# Patient Record
Sex: Male | Born: 1945 | Race: White | Hispanic: No | Marital: Married | Smoking: Current every day smoker
Health system: Southern US, Community
[De-identification: ages and names within clinical notes are randomized; demographics above are authoritative.]

## PROBLEM LIST (undated history)

## (undated) DIAGNOSIS — K4090 Unilateral inguinal hernia, without obstruction or gangrene, not specified as recurrent: Secondary | ICD-10-CM

## (undated) DIAGNOSIS — M1990 Unspecified osteoarthritis, unspecified site: Secondary | ICD-10-CM

## (undated) DIAGNOSIS — E785 Hyperlipidemia, unspecified: Secondary | ICD-10-CM

## (undated) DIAGNOSIS — J449 Chronic obstructive pulmonary disease, unspecified: Secondary | ICD-10-CM

## (undated) DIAGNOSIS — I714 Abdominal aortic aneurysm, without rupture, unspecified: Secondary | ICD-10-CM

## (undated) DIAGNOSIS — M5030 Other cervical disc degeneration, unspecified cervical region: Secondary | ICD-10-CM

## (undated) DIAGNOSIS — J479 Bronchiectasis, uncomplicated: Secondary | ICD-10-CM

## (undated) DIAGNOSIS — A150 Tuberculosis of lung: Secondary | ICD-10-CM

## (undated) DIAGNOSIS — M542 Cervicalgia: Secondary | ICD-10-CM

## (undated) DIAGNOSIS — R0789 Other chest pain: Secondary | ICD-10-CM

## (undated) HISTORY — DX: Bronchiectasis, uncomplicated: J47.9

## (undated) HISTORY — PX: OTHER SURGICAL HISTORY: SHX169

## (undated) HISTORY — PX: BRONCHOSCOPY: SUR163

## (undated) HISTORY — DX: Unilateral inguinal hernia, without obstruction or gangrene, not specified as recurrent: K40.90

## (undated) HISTORY — DX: Abdominal aortic aneurysm, without rupture: I71.4

## (undated) HISTORY — DX: Hyperlipidemia, unspecified: E78.5

## (undated) HISTORY — DX: Tuberculosis of lung: A15.0

## (undated) HISTORY — DX: Other cervical disc degeneration, unspecified cervical region: M50.30

## (undated) HISTORY — DX: Other chest pain: R07.89

## (undated) HISTORY — DX: Unspecified osteoarthritis, unspecified site: M19.90

## (undated) HISTORY — DX: Chronic obstructive pulmonary disease, unspecified: J44.9

## (undated) HISTORY — DX: Cervicalgia: M54.2

## (undated) HISTORY — PX: WRIST SURGERY: SHX841

## (undated) HISTORY — DX: Abdominal aortic aneurysm, without rupture, unspecified: I71.40

---

## 2004-03-21 ENCOUNTER — Ambulatory Visit: Payer: Self-pay | Admitting: Internal Medicine

## 2004-03-29 ENCOUNTER — Ambulatory Visit: Payer: Self-pay | Admitting: Internal Medicine

## 2004-04-05 ENCOUNTER — Ambulatory Visit: Payer: Self-pay | Admitting: Internal Medicine

## 2004-04-05 ENCOUNTER — Encounter: Payer: Self-pay | Admitting: Family Medicine

## 2004-04-05 ENCOUNTER — Ambulatory Visit: Admission: RE | Admit: 2004-04-05 | Discharge: 2004-04-05 | Payer: Self-pay | Admitting: Internal Medicine

## 2004-04-05 ENCOUNTER — Encounter (INDEPENDENT_AMBULATORY_CARE_PROVIDER_SITE_OTHER): Payer: Self-pay | Admitting: *Deleted

## 2005-05-03 ENCOUNTER — Ambulatory Visit (HOSPITAL_COMMUNITY): Admission: RE | Admit: 2005-05-03 | Discharge: 2005-05-03 | Payer: Self-pay | Admitting: Orthopedic Surgery

## 2005-05-10 ENCOUNTER — Ambulatory Visit (HOSPITAL_COMMUNITY): Admission: RE | Admit: 2005-05-10 | Discharge: 2005-05-10 | Payer: Self-pay | Admitting: Orthopedic Surgery

## 2005-08-14 ENCOUNTER — Encounter: Payer: Self-pay | Admitting: Family Medicine

## 2005-08-14 ENCOUNTER — Ambulatory Visit (HOSPITAL_BASED_OUTPATIENT_CLINIC_OR_DEPARTMENT_OTHER): Admission: RE | Admit: 2005-08-14 | Discharge: 2005-08-14 | Payer: Self-pay | Admitting: Orthopedic Surgery

## 2005-08-14 ENCOUNTER — Encounter (INDEPENDENT_AMBULATORY_CARE_PROVIDER_SITE_OTHER): Payer: Self-pay | Admitting: Specialist

## 2008-04-27 ENCOUNTER — Ambulatory Visit: Payer: Self-pay | Admitting: Family Medicine

## 2008-04-27 DIAGNOSIS — M542 Cervicalgia: Secondary | ICD-10-CM

## 2008-04-27 DIAGNOSIS — M1990 Unspecified osteoarthritis, unspecified site: Secondary | ICD-10-CM | POA: Insufficient documentation

## 2008-05-12 ENCOUNTER — Encounter: Admission: RE | Admit: 2008-05-12 | Discharge: 2008-05-12 | Payer: Self-pay | Admitting: Family Medicine

## 2008-05-13 ENCOUNTER — Ambulatory Visit: Payer: Self-pay | Admitting: Family Medicine

## 2008-05-13 ENCOUNTER — Encounter: Admission: RE | Admit: 2008-05-13 | Discharge: 2008-05-13 | Payer: Self-pay | Admitting: Family Medicine

## 2008-05-13 DIAGNOSIS — E785 Hyperlipidemia, unspecified: Secondary | ICD-10-CM | POA: Insufficient documentation

## 2008-05-13 DIAGNOSIS — M5030 Other cervical disc degeneration, unspecified cervical region: Secondary | ICD-10-CM | POA: Insufficient documentation

## 2008-05-13 DIAGNOSIS — F17200 Nicotine dependence, unspecified, uncomplicated: Secondary | ICD-10-CM | POA: Insufficient documentation

## 2008-05-18 ENCOUNTER — Encounter: Payer: Self-pay | Admitting: Family Medicine

## 2008-05-19 ENCOUNTER — Telehealth: Payer: Self-pay | Admitting: Family Medicine

## 2008-05-24 ENCOUNTER — Encounter: Payer: Self-pay | Admitting: Family Medicine

## 2008-05-24 ENCOUNTER — Ambulatory Visit: Payer: Self-pay

## 2008-08-12 ENCOUNTER — Telehealth (INDEPENDENT_AMBULATORY_CARE_PROVIDER_SITE_OTHER): Payer: Self-pay | Admitting: *Deleted

## 2008-08-16 ENCOUNTER — Ambulatory Visit: Payer: Self-pay | Admitting: Family Medicine

## 2008-08-16 ENCOUNTER — Encounter: Admission: RE | Admit: 2008-08-16 | Discharge: 2008-08-16 | Payer: Self-pay | Admitting: Family Medicine

## 2008-08-16 DIAGNOSIS — I714 Abdominal aortic aneurysm, without rupture, unspecified: Secondary | ICD-10-CM | POA: Insufficient documentation

## 2008-08-16 DIAGNOSIS — R930 Abnormal findings on diagnostic imaging of skull and head, not elsewhere classified: Secondary | ICD-10-CM | POA: Insufficient documentation

## 2008-08-17 ENCOUNTER — Encounter: Payer: Self-pay | Admitting: Family Medicine

## 2008-08-17 LAB — CONVERTED CEMR LAB
ANA Titer 1: 1:40 {titer} — ABNORMAL HIGH
Anti Nuclear Antibody(ANA): POSITIVE — AB
Direct LDL: 72 mg/dL
Rheumatoid fact SerPl-aCnc: 98 intl units/mL — ABNORMAL HIGH (ref 0–20)
Sed Rate: 2 mm/hr (ref 0–16)

## 2008-08-19 ENCOUNTER — Encounter: Payer: Self-pay | Admitting: Family Medicine

## 2008-11-05 ENCOUNTER — Encounter: Payer: Self-pay | Admitting: Family Medicine

## 2009-01-06 ENCOUNTER — Encounter: Payer: Self-pay | Admitting: Family Medicine

## 2009-02-18 ENCOUNTER — Ambulatory Visit: Payer: Self-pay | Admitting: Family Medicine

## 2009-02-18 DIAGNOSIS — J441 Chronic obstructive pulmonary disease with (acute) exacerbation: Secondary | ICD-10-CM

## 2009-04-04 ENCOUNTER — Ambulatory Visit: Payer: Self-pay | Admitting: Family Medicine

## 2009-04-04 ENCOUNTER — Encounter: Admission: RE | Admit: 2009-04-04 | Discharge: 2009-04-04 | Payer: Self-pay | Admitting: Family Medicine

## 2009-04-04 DIAGNOSIS — R0789 Other chest pain: Secondary | ICD-10-CM | POA: Insufficient documentation

## 2009-04-19 ENCOUNTER — Ambulatory Visit: Payer: Self-pay | Admitting: Pulmonary Disease

## 2009-04-22 ENCOUNTER — Encounter: Payer: Self-pay | Admitting: Pulmonary Disease

## 2009-04-22 ENCOUNTER — Telehealth (INDEPENDENT_AMBULATORY_CARE_PROVIDER_SITE_OTHER): Payer: Self-pay | Admitting: *Deleted

## 2009-04-25 ENCOUNTER — Ambulatory Visit: Payer: Self-pay | Admitting: Diagnostic Radiology

## 2009-04-25 ENCOUNTER — Ambulatory Visit (HOSPITAL_BASED_OUTPATIENT_CLINIC_OR_DEPARTMENT_OTHER): Admission: RE | Admit: 2009-04-25 | Discharge: 2009-04-25 | Payer: Self-pay | Admitting: Pulmonary Disease

## 2009-06-02 ENCOUNTER — Ambulatory Visit: Payer: Self-pay | Admitting: Pulmonary Disease

## 2009-06-02 DIAGNOSIS — J479 Bronchiectasis, uncomplicated: Secondary | ICD-10-CM

## 2009-08-23 ENCOUNTER — Encounter: Payer: Self-pay | Admitting: Family Medicine

## 2009-09-14 ENCOUNTER — Ambulatory Visit: Payer: Self-pay | Admitting: Family Medicine

## 2009-09-14 ENCOUNTER — Encounter: Admission: RE | Admit: 2009-09-14 | Discharge: 2009-09-14 | Payer: Self-pay | Admitting: Family Medicine

## 2009-09-14 DIAGNOSIS — K4090 Unilateral inguinal hernia, without obstruction or gangrene, not specified as recurrent: Secondary | ICD-10-CM | POA: Insufficient documentation

## 2009-09-20 ENCOUNTER — Encounter: Payer: Self-pay | Admitting: Family Medicine

## 2010-03-21 ENCOUNTER — Encounter: Payer: Self-pay | Admitting: Family Medicine

## 2010-04-06 ENCOUNTER — Ambulatory Visit: Payer: Self-pay | Admitting: Family Medicine

## 2010-06-04 LAB — CONVERTED CEMR LAB
ALT: 11 units/L (ref 0–53)
AST: 11 units/L (ref 0–37)
Albumin: 4.3 g/dL (ref 3.5–5.2)
Alkaline Phosphatase: 62 units/L (ref 39–117)
BUN: 12 mg/dL (ref 6–23)
CO2: 24 meq/L (ref 19–32)
Calcium: 9.2 mg/dL (ref 8.4–10.5)
Chloride: 103 meq/L (ref 96–112)
Cholesterol: 269 mg/dL — ABNORMAL HIGH (ref 0–200)
Creatinine, Ser: 0.66 mg/dL (ref 0.40–1.50)
Glucose, Bld: 96 mg/dL (ref 70–99)
HCT: 47.8 % (ref 39.0–52.0)
HDL: 57 mg/dL (ref >39)
Hemoglobin: 15.8 g/dL (ref 13.0–17.0)
LDL Cholesterol: 172 mg/dL — ABNORMAL HIGH (ref 0–99)
MCHC: 33.1 g/dL (ref 30.0–36.0)
MCV: 93.2 fL (ref 78.0–100.0)
PSA: 0.76 ng/mL (ref 0.10–4.00)
Platelets: 241 10*3/uL (ref 150–400)
Potassium: 4.7 meq/L (ref 3.5–5.3)
RBC: 5.13 M/uL (ref 4.22–5.81)
RDW: 14.9 % (ref 11.5–15.5)
Sodium: 141 meq/L (ref 135–145)
Total Bilirubin: 0.6 mg/dL (ref 0.3–1.2)
Total CHOL/HDL Ratio: 4.7
Total Protein: 7.3 g/dL (ref 6.0–8.3)
Triglycerides: 198 mg/dL — ABNORMAL HIGH (ref <150)
VLDL: 40 mg/dL (ref 0–40)
WBC: 11.6 10*3/uL — ABNORMAL HIGH (ref 4.0–10.5)

## 2010-06-06 NOTE — Assessment & Plan Note (Signed)
Summary: hfu/  pt okay with h/p mbw   Visit Type:  Follow-up Copy to:  PCP Primary Provider/Referring Provider:  Seymour Bars DO  CC:  Pt here for follow up. Pt c/o productive cough with yellow mucus in AM and clear in the PM starting yesterday. Pt c/o being lethargic, S.O.B., wheezing, chest tightness, chest and head congestion, and lightheaded.  History of Present Illness: 63/M, ex heavy smoker,of [place] descent for evaluation of COPd & chest pain. He reports knife like rt chest wall pain x few weeks, non exertional, non pleuritic. He reports dyspne aon exertion esp walking uphill or climbing stairs. He sees Dr Durenda Age for RA, on methotrexate, HLA B 27 pos.(reviewed last note) He quit smoking in 4/10. Underwent bronchoscopy in 11/05 by Sherene Sires for RML cavitary mass which has since resolved , afb/ funagal neg. He reports Tb at age 35  CXR 04/04/09 -hyperinflation. spirometry >> moderate airway obstruction, FEv1 60% reviewed CT chest - RMl bronchectasis, ? related to old TB, emphysema.  June 02, 2009 3:00 PM  c/o head & chest cold x 2 days , green phlegm, increased dyspnea & wheezing  Current Medications (verified): 1)  Lipitor 80 Mg Tabs (Atorvastatin Calcium) .Marland Kitchen.. 1 Tab By Mouth Qhs 2)  Ibuprofen 800 Mg Tabs (Ibuprofen) .Marland Kitchen.. 1 Tab By Mouth Three Times A Day With Food X 14 Days 3)  Folic Acid 1 Mg Tabs (Folic Acid) .... 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 2 grown kids in [place].  1 granddaughter. Smokes 1.5 ppd x 50 yrs. No regular exercise. 10 caffeine/ day Patient states former smoker.   Review of Systems       The patient complains of dyspnea on exertion and prolonged cough.   The patient denies anorexia, fever, weight loss, weight gain, vision loss, decreased hearing, hoarseness, chest pain, syncope, peripheral edema, headaches, hemoptysis, abdominal pain, melena, hematochezia, severe indigestion/heartburn, hematuria, muscle weakness, suspicious skin lesions, difficulty walking, depression, unusual weight change, and abnormal bleeding.    Vital Signs:  Patient profile:   65 year old male Height:      71.5 inches Weight:      160 pounds O2 Sat:      96 % on Room air Temp:     98.6 degrees F oral Pulse rate:   76 / minute BP sitting:   92 / 64  (right arm) Cuff size:   regular  Vitals Entered By: Zackery Barefoot CMA (June 02, 2009 2:16 PM)  O2 Flow:  Room air CC: Pt here for follow up. Pt c/o productive cough with yellow mucus in AM and clear in the PM starting yesterday. Pt c/o being lethargic, S.O.B., wheezing, chest tightness, chest and head congestion, lightheaded Comments Medications reviewed with patient Verified pt's contact number Zackery Barefoot CMA  June 02, 2009 2:21 PM    Physical Exam  Additional Exam:  Gen. Pleasant, thin, in no distress, normal affect ENT - no lesions, no post nasal drip, nares congested Neck: No JVD, no thyromegaly, no carotid bruits Lungs: no use of accessory muscles, no dullness to percussion, decreased without rales or rhonchi  Cardiovascular: Rhythm regular, heart sounds  normal, no murmurs or gallops, no peripheral edema  Musculoskeletal: No deformities,  no cyanosis or clubbing      Impression & Recommendations:  Problem # 1:  BRONCHITIS, ACUTE (ICD-466.0)  ABx , short course of steroids by mouth . he will call if no better . OTC meds for symptomatic relief. His updated medication list for this problem includes:    Avelox 400 Mg Tabs (Moxifloxacin hcl) ..... Once daily  Orders: Est. Patient Level III (14782)  Problem # 2:  CHRONIC OBSTRUCTIVE PULMONARY DISEASE, ACUTE EXACERBATION  (ICD-491.21)  Will assess need for LABA next visit    Orders: Est. Patient Level III (95621)  Problem # 3:  BRONCHIECTASIS W/O ACUTE EXACERBATION (ICD-494.0) Assessment: New Focal RML   Medications Added to Medication List This Visit: 1)  Folic Acid 1 Mg Tabs (Folic acid) .... Take 1 tablet by mouth once a day 2)  Avelox 400 Mg Tabs (Moxifloxacin hcl) .... Once daily 3)  Prednisone 10 Mg Tabs (Prednisone) .... Take  2 tabs daily x 5 days, then 1 tab daily x5 days then stop. #15  Patient Instructions: 1)  Please schedule a follow-up appointment in 3 months. 2)  Antibiotic x 7 days 3)  Prednisone as directed 4)  Tylenol sinus for congestion 5)   Robitussin DM cough syrup 2 tsp two times a day  6)  Copy sent to: Seymour Bars Prescriptions: PREDNISONE 10 MG TABS (PREDNISONE) Take  2 tabs daily x 5 days, then 1 tab daily x5 days then stop. #15  #15 x 1   Entered and Authorized by:   Comer Locket Vassie Loll MD   Signed by:   Comer Locket Vassie Loll MD on 06/02/2009   Method used:   Electronically to        [facility] [phone]* (retail)       [address] [place], [place]  [address]       Ph: [phone]       Fax: [phone]   RxID:   [id number] AVELOX 400 MG TABS (MOXIFLOXACIN HCL) once daily  #7 x 0   Entered and Authorized by:   Comer Locket. Vassie Loll MD   Signed by:   Comer Locket Vassie Loll MD on 06/02/2009   Method used:   Electronically to        [facility] [phone]* (retail)       [address] [place], [place]  [address]       Ph: [phone]       Fax: [phone]   RxID:   [id number]

## 2010-06-06 NOTE — Assessment & Plan Note (Signed)
Summary: ? hernia   Vital Signs:  Patient profile:   65 year old male Height:      71.5 inches Weight:      159 pounds BMI:     21.95 O2 Sat:      97 % on Room air Pulse rate:   72 / minute BP sitting:   98 / 66  (left arm) Cuff size:   regular  Vitals Entered By: Payton Spark CMA (Sep 14, 2009 9:39 AM)  O2 Flow:  Room air CC: ? hernia LLQ x 2 months.   Primary Care Provider:  Seymour Bars DO  CC:  ? hernia LLQ x 2 months..  History of Present Illness: 65 yo WM presents for LLQ pain x 2 months on and off.  Occurs with lifting. Feels like a pulling sensation.   He has seen a buldge.  He denies a hx of hernia.  Denies any radiation into the groin or scrotum.  Denies diarrhea or constipation  or blood in the stool.  Denies any fever.  He always has a poor appetite.  His weight has been stable.  He has never had a colonoscopy.  Has never had abdominal surgery.  Denies any voiding complaints.  Denies any LE edema.   Current Medications (verified): 1)  Lipitor 80 Mg Tabs (Atorvastatin Calcium) .Marland Kitchen.. 1 Tab By Mouth Qhs 2)  Folic Acid 1 Mg Tabs (Folic Acid) .... Take 1 Tablet By Mouth Once A Day 3)  Methotrexate 2.5 Mg Tabs (Methotrexate Sodium) .... Take 10 Tabs By Mouth Every Monday  Allergies (verified): No Known Drug Allergies  Past History:  Past Medical History: Hx of TB degenerative arthritis smoker-- quit 4-10 COPD  Past Surgical History: Reviewed history from 08/16/2008 and no changes required. L wrist surgery 1984 R MCP surgery 2006 bronchoscopy for scarred lung 2002 from TB R wrist/ hand surgery 05-2008  Social History: Reviewed history from 04/19/2009 and no changes required. 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 2 grown kids in [place].  1 granddaughter. Smokes 1.5 ppd x 50 yrs. No regular exercise. 10 caffeine/ day Patient states former smoker.   Review of Systems      See HPI  Physical  Exam  General:  alert, well-developed, well-nourished, and well-hydrated.   Head:  normocephalic and atraumatic.   Eyes:  sclera non icteric Nose:  no nasal discharge.   Mouth:  pharynx pink and moist.  edentuous Neck:  no masses.   Lungs:  prolonged exp phase with lungs CTA w/o W/R/R; nonlabored  Heart:  normal rate, regular rhythm, and no murmur.   Abdomen:  soft.  LLQ TTP with buldge palpable on standing that is tender and reducible, quarter sized.  NABS. Genitalia:  no testicular buldges, nodules or tenderness Extremities:  no LE edema Skin:  color normal.   Psych:  good eye contact, not anxious appearing, and not depressed appearing.     Impression & Recommendations:  Problem # 1:  ABDOMINAL PAIN, LEFT LOWER QUADRANT (ICD-789.04) Assessment New Likely a hernia.  Less likely diverticulitis.  Will get a CT to confirm presence of hernia today.  F/u results tomorrow.  Refer to gen surgery if indicated.  He is overdue for a colonosocpy.  No sign of acute abdomen.  Avoid heavy lifting for now.   Orders: T-CT Abdomen/pelvis w (04540)  Complete Medication List: 1)  Lipitor 80 Mg Tabs (Atorvastatin calcium) .Marland Kitchen.. 1 tab by mouth qhs  2)  Folic Acid 1 Mg Tabs (Folic acid) .... Take 1 tablet by mouth once a day 3)  Methotrexate 2.5 Mg Tabs (Methotrexate sodium) .... Take 10 tabs by mouth every monday  Patient Instructions: 1)  CT abdomen/ pelvis downstairs today. 2)  Will call you w/ results tomorrow. 3)  If + for hernia, will schedule you an appt with general surgery in the office to discuss repair.

## 2010-06-06 NOTE — Letter (Signed)
Summary: [facility]   Imported By: Lanelle Bal 09/15/2009 12:17:18  _____________________________________________________________________  External Attachment:    Type:   Image     Comment:   External Document

## 2010-06-06 NOTE — Consult Note (Signed)
Summary: [facility]   Imported By: Lanelle Bal 10/06/2009 09:20:26  _____________________________________________________________________  External Attachment:    Type:   Image     Comment:   External Document

## 2010-06-06 NOTE — Assessment & Plan Note (Signed)
Summary: COPD excacerbation   Vital Signs:  Patient profile:   65 year old male Height:      71.5 inches Weight:      149 pounds BMI:     20.57 O2 Sat:      94 % on Room air Temp:     98.8 degrees F oral Pulse rate:   76 / minute BP sitting:   107 / 61  (left arm) Cuff size:   regular  Vitals Entered By: Payton Spark CMA (April 06, 2010 2:50 PM)  O2 Flow:  Room air CC: Congestion x 2 weeks.   Primary Care Provider:  Seymour Bars DO  CC:  Congestion x 2 weeks.Marland Kitchen  History of Present Illness: Congestion x 2 weeks. Has been SOB.  Hx of COPD.  Productive in the AM. No fever.  No ST, ear pain or HA.  He is a smoker. He saw Dr. Vassie Loll.   Current Medications (verified): 1)  Lipitor 80 Mg Tabs (Atorvastatin Calcium) .Marland Kitchen.. 1 Tab By Mouth Qhs 2)  Folic Acid 1 Mg Tabs (Folic Acid) .... Take 1 Tablet By Mouth Once A Day 3)  Methotrexate 2.5 Mg Tabs (Methotrexate Sodium) .... Take 10 Tabs By Mouth Every Monday  Allergies (verified): No Known Drug Allergies  Past History:  Past Medical History: Last updated: 09/14/2009 Hx of TB degenerative arthritis smoker-- quit 4-10 COPD  Past Surgical History: Last updated: 08/16/2008 L wrist surgery 1984 R MCP surgery 2006 bronchoscopy for scarred lung 2002 from TB R wrist/ hand surgery 05-2008  Family History: Last updated: 04/27/2008 brother AMI, stroke. 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 2 grown kids in [place].  1 granddaughter. Smokes 1.5 ppd x 50 yrs. No regular exercise. 10 caffeine/ day Patient states former smoker.   Social History: Reviewed history from 04/19/2009 and no changes required. 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 2 grown kids in [place].  1  granddaughter. Smokes 1.5 ppd x 50 yrs. No regular exercise. 10 caffeine/ day Patient states former smoker.   Physical Exam  General:  Well-developed,well-nourished,in no acute distress; alert,appropriate and cooperative throughout examination Head:  Normocephalic and atraumatic without obvious abnormalities. No apparent alopecia or balding. Eyes:  No corneal or conjunctival inflammation noted. EOMI. Perrla. Ears:  External ear exam shows no significant lesions or deformities.  Otoscopic examination reveals clear canals, tympanic membranes are intact bilaterally without bulging, retraction, inflammation or discharge. Hearing is grossly normal bilaterally. Nose:  External nasal examination shows no deformity or inflammation. Nasal mucosa are pink and moist without lesions or exudates. Mouth:  Oral mucosa and oropharynx without lesions or exudates.  Neck:  No deformities, masses, or tenderness noted. Lungs:  Normal respiratory effort, chest expands symmetrically. Lungs are clear to auscultation, no crackles or wheezes. Heart:  Normal rate and regular rhythm. S1 and S2 normal without gallop, murmur, click, rub or other extra sounds. Skin:  no rashes.   Cervical Nodes:  No lymphadenopathy noted Psych:  Cognition and judgment appear intact. Alert and cooperative with normal attention span and concentration. No apparent delusions, illusions, hallucinations   Impression & Recommendations:  Problem # 1:  CHRONIC OBSTRUCTIVE PULMONARY DISEASE, ACUTE EXACERBATION (ICD-491.21) COPD exacerbation.  Will tx with ABX.  I don't think he is severe enough  for a course of steroid. He is not wheeezing adn no SOB in the room. Pulse ox is at his baseline.  Needs to keep f/u with Dr. Vassie Loll if has one or see Dr. .  Per his CT he has severe COPD so I am not sure why he is not on something prophylactically.there is a note from Dr. Vassie Loll about starting a SABA but I see nothing on his medlist.   I did write for an  albuterol inhaler. Hopefully if starts using this will be able to avoid steroid. Call if not getting better or suddenly gets worse or SOB.    Complete Medication List: 1)  Lipitor 80 Mg Tabs (Atorvastatin calcium) .Marland Kitchen.. 1 tab by mouth qhs 2)  Folic Acid 1 Mg Tabs (Folic acid) .... Take 1 tablet by mouth once a day 3)  Methotrexate 2.5 Mg Tabs (Methotrexate sodium) .... Take 10 tabs by mouth every monday 4)  Avelox 400 Mg Tabs (Moxifloxacin hcl) .... Take 1 tablet by mouth once a day x 5 days 5)  Proair Hfa 108 (90 Base) Mcg/act Aers (Albuterol sulfate) .... 2- 4 puffs inhaled eveyr 4-6 hours as needed for shortness of breath.  Patient Instructions: 1)  Call if not better in one week 2)   Use the proair 2 puffs every 4-6 hours as needed for cough and shortness of breath.  Prescriptions: PROAIR HFA 108 (90 BASE) MCG/ACT AERS (ALBUTEROL SULFATE) 2- 4 puffs inhaled eveyr 4-6 hours as needed for shortness of breath.  #1 x 1   Entered and Authorized by:   Nani Gasser MD   Signed by:   Nani Gasser MD on 04/06/2010   Method used:   Electronically to        [facility] [phone]* (retail)       [address] [place], [place]  [address]       Ph: [phone]       Fax: [phone]   RxID:   [id number] AVELOX 400 MG TABS (MOXIFLOXACIN HCL) Take 1 tablet by mouth once a day x 5 days  #5 x 0   Entered and Authorized by:   Nani Gasser MD   Signed by:   Nani Gasser MD on 04/06/2010   Method used:   Electronically to        [facility] [phone]* (retail)       [address]       [place], [place]  [address]       Ph: [phone]       Fax: [phone]   RxID:   [id number]    Orders Added: 1)  Est. Patient Level IV [02585]  Appended Document: COPD excacerbation Pharmacy calls and insurance will not pay for proair so they are switching to the Ventolin HFA inhaler with 1 refill. KJ LPN 27/11/8240

## 2010-06-06 NOTE — Letter (Signed)
Summary: [facility]   Imported By: Lanelle Bal 04/11/2010 14:04:05  _____________________________________________________________________  External Attachment:    Type:   Image     Comment:   External Document

## 2010-07-07 ENCOUNTER — Telehealth: Payer: Self-pay | Admitting: Family Medicine

## 2010-07-10 ENCOUNTER — Encounter: Payer: Self-pay | Admitting: Family Medicine

## 2010-07-10 ENCOUNTER — Ambulatory Visit (INDEPENDENT_AMBULATORY_CARE_PROVIDER_SITE_OTHER): Payer: Medicare Other | Admitting: Family Medicine

## 2010-07-10 DIAGNOSIS — J449 Chronic obstructive pulmonary disease, unspecified: Secondary | ICD-10-CM | POA: Insufficient documentation

## 2010-07-10 DIAGNOSIS — E785 Hyperlipidemia, unspecified: Secondary | ICD-10-CM

## 2010-07-10 DIAGNOSIS — I714 Abdominal aortic aneurysm, without rupture: Secondary | ICD-10-CM

## 2010-07-10 DIAGNOSIS — J4489 Other specified chronic obstructive pulmonary disease: Secondary | ICD-10-CM | POA: Insufficient documentation

## 2010-07-10 DIAGNOSIS — M069 Rheumatoid arthritis, unspecified: Secondary | ICD-10-CM

## 2010-07-11 LAB — CONVERTED CEMR LAB
ALT: 9 units/L (ref 0–53)
AST: 13 units/L (ref 0–37)
Albumin: 4.3 g/dL (ref 3.5–5.2)
Alkaline Phosphatase: 48 units/L (ref 39–117)
BUN: 13 mg/dL (ref 6–23)
CO2: 29 meq/L (ref 19–32)
Calcium: 8.8 mg/dL (ref 8.4–10.5)
Chloride: 106 meq/L (ref 96–112)
Cholesterol: 137 mg/dL (ref 0–200)
Creatinine, Ser: 0.59 mg/dL (ref 0.40–1.50)
Glucose, Bld: 108 mg/dL — ABNORMAL HIGH (ref 70–99)
HCT: 43.8 % (ref 39.0–52.0)
HDL: 52 mg/dL (ref >39)
Hemoglobin: 14.6 g/dL (ref 13.0–17.0)
LDL Cholesterol: 72 mg/dL (ref 0–99)
MCHC: 33.3 g/dL (ref 30.0–36.0)
MCV: 95.8 fL (ref 78.0–100.0)
PSA, Free Pct: 50 (ref >25)
PSA, Free: 0.2 ng/mL
PSA: 0.4 ng/mL (ref <=4.00)
Platelets: 216 10*3/uL (ref 150–400)
Potassium: 4.2 meq/L (ref 3.5–5.3)
RBC: 4.57 M/uL (ref 4.22–5.81)
RDW: 15.8 % — ABNORMAL HIGH (ref 11.5–15.5)
Sed Rate: 1 mm/hr (ref 0–16)
Sodium: 142 meq/L (ref 135–145)
Total Bilirubin: 0.8 mg/dL (ref 0.3–1.2)
Total CHOL/HDL Ratio: 2.6
Total Protein: 6.4 g/dL (ref 6.0–8.3)
Triglycerides: 64 mg/dL (ref <150)
VLDL: 13 mg/dL (ref 0–40)
WBC: 5.8 10*3/uL (ref 4.0–10.5)

## 2010-07-13 NOTE — Progress Notes (Signed)
Summary: Rx. refill on Lipitor and Albuterol  Phone Note Refill Request Message from:  Patient on July 07, 2010 4:05 PM  Refills Requested: Medication #1:  LIPITOR 80 MG TABS 1 tab by mouth qhs Patient states that he has been out for 8 days and he has been told by his pharmacy that they have tried to reach [place] but no one is responding.Marland KitchenMarland KitchenPlease call in his Lipitor as soon as possible. He also needs some more Albuterol called in that was given to him one day when Dr. Cathey Endow was out. Thanks.Michaelle Copas  July 07, 2010 4:07 PM   Initial call taken by: Michaelle Copas,  July 07, 2010 4:07 PM  Follow-up for Phone Call        cannot fill lipitor -- overdue for labs. Will fill his albuterol. scheduel OV to see me and do fasting labs in the next 2 wks. Follow-up by: Seymour Bars DO,  July 07, 2010 4:09 PM    Prescriptions: PROAIR HFA 108 (90 BASE) MCG/ACT AERS (ALBUTEROL SULFATE) 2- 4 puffs inhaled eveyr 4-6 hours as needed for shortness of breath.  #1 x 1   Entered and Authorized by:   Seymour Bars DO   Signed by:   Seymour Bars DO on 07/07/2010   Method used:   Electronically to        [facility] [phone]* (retail)       [address] [place], [place]  [address]       Ph: [phone]       Fax: [phone]   RxID:   [id number]

## 2010-07-18 NOTE — Assessment & Plan Note (Signed)
Summary: f/u cholesterol   Vital Signs:  Patient profile:   65 year old male Height:      71.5 inches Weight:      158 pounds BMI:     21.81 O2 Sat:      96 % on Room air Pulse rate:   70 / minute BP sitting:   111 / 70  (left arm) Cuff size:   regular  Vitals Entered By: Payton Spark CMA (July 10, 2010 8:17 AM)  O2 Flow:  Room air CC: F/U cholesterol   Primary Care Provider:  Seymour Bars DO  CC:  F/U cholesterol.  History of Present Illness: 65 yo WM presents for f/u fasting labs.  He has hx of hyperlipidemia, RA (see Dr D[facility]en P[facility] at [facility]), COPD (has seen Dr Vassie Loll) and AAA.  He continues to smoke.  Denies CP but has chronic DOE.  Using a rescue inhaler only at night.  He is now on MTX and Folic Acid whch has really helped his RA.        Current Medications (verified): 1)  Lipitor 80 Mg Tabs (Atorvastatin Calcium) .Marland Kitchen.. 1 Tab By Mouth Qhs 2)  Folic Acid 1 Mg Tabs (Folic Acid) .... Take 1 Tablet By Mouth Once A Day 3)  Methotrexate 2.5 Mg Tabs (Methotrexate Sodium) .... Take 10 Tabs By Mouth Every Monday 4)  Avelox 400 Mg Tabs (Moxifloxacin Hcl) .... Take 1 Tablet By Mouth Once A Day X 5 Days 5)  Proair Hfa 108 (90 Base) Mcg/act Aers (Albuterol Sulfate) .... 2- 4 Puffs Inhaled Eveyr 4-6 Hours As Needed For Shortness of Breath.  Allergies (verified): No Known Drug Allergies  Past History:  Past Medical History: Hx of TB RA -- DR Dareen Piano smoker-- quit 4-10 COPD-- Dr Vassie Loll AAA  Past Surgical History: Reviewed history from 08/16/2008 and no changes required. L wrist surgery 1984 R MCP surgery 2006 bronchoscopy for scarred lung 2002 from TB R wrist/ hand surgery 05-2008  Family History: Reviewed history from 04/27/2008 and no changes required. brother AMI, stroke. father arthritis, died of old age brother prostate cancer mother died, male cancer in 34  Social History: Reviewed history from 04/19/2009 and no changes required. 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 2 grown kids in [place].  1 granddaughter. Smokes 1.5 ppd x 50 yrs. No regular exercise. 10 caffeine/ day Patient states former smoker.   Review of Systems      See HPI  Physical Exam  General:  alert, well-developed, well-nourished, and well-hydrated.   Head:  normocephalic and atraumatic.   Mouth:  pharynx pink and moist.   Neck:  no masses.   Chest Wall:  barrel chested Lungs:  prolonged exp phase, non labored.  No W/R/R Heart:  Normal rate and regular rhythm. S1 and S2 normal without gallop, murmur, click, rub or other extra sounds. Abdomen:  no audible AA bruits.  soft.  ND.  NABS Msk:  much improvemenent in synovitis of hands Extremities:  no LE edema Skin:  color normal.   Cervical Nodes:  No lymphadenopathy noted Psych:  good eye contact, not anxious appearing, and not depressed appearing.     Impression & Recommendations:  Problem # 1:  DYSLIPIDEMIA (ICD-272.4) Update labs today and adjust meds if indicated.   His updated medication list for this problem includes:    Lipitor 80 Mg Tabs (Atorvastatin calcium) .Marland Kitchen... 1 tab by mouth qhs  Orders: T-Lipid Profile ([address]454)  Labs Reviewed: SGOT: 11 (05/13/2008)   SGPT: 11 (05/13/2008)   HDL:57 (05/13/2008)  LDL:172 (05/13/2008)  Chol:269 (05/13/2008)  Trig:198 (05/13/2008)  Problem # 2:  RHEUMATOID ARTHRITIS (ICD-714.0) Appears to be much improved, followed by Dr Dareen Piano, on MTX + Folic Acid.  Add ESR and CBC to labs today. His updated medication list for this problem includes:    Methotrexate 2.5 Mg Tabs (Methotrexate sodium) .Marland Kitchen... Take 10 tabs by mouth every monday  Orders: T-Sed Rate (Automated) ([address]-60454) T-CBC No Diff ([id number])  Problem # 3:  AAA (ICD-441.4) Asymptomatic but has hyperlipidemia and smoking as risk factors.  BP looks great.  Will plan to either u/s or CT at upcoming visit to recheck size.    Problem # 4:  COPD  (ICD-496) Currently has DOE at baseline.  Did PFTs in 2010 and is due to repeat this year, either here to back w/ Dr Vassie Loll.  Will likely need to add Spiriva once daily.  Encouraged smoking cessation.  Update CXR at CPE in 2 mos. His updated medication list for this problem includes:    Proair Hfa 108 (90 Base) Mcg/act Aers (Albuterol sulfate) .Marland Kitchen... 2- 4 puffs inhaled eveyr 4-6 hours as needed for shortness of breath.  Complete Medication List: 1)  Lipitor 80 Mg Tabs (Atorvastatin calcium) .Marland Kitchen.. 1 tab by mouth qhs 2)  Folic Acid 1 Mg Tabs (Folic acid) .... Take 1 tablet by mouth once a day 3)  Methotrexate 2.5 Mg Tabs (Methotrexate sodium) .... Take 10 tabs by mouth every monday 4)  Proair Hfa 108 (90 Base) Mcg/act Aers (Albuterol sulfate) .... 2- 4 puffs inhaled eveyr 4-6 hours as needed for shortness of breath.  Other Orders: T-Comprehensive Metabolic Panel [id number]) T-PSA Total ([id number])  Patient Instructions: 1)  Update labs today. 2)  I will send copy to Dr Dareen Piano. 3)  Return for a PHYSICAL with EKG and repeat AAA screening in 2 mos.   Orders Added: 1)  T-Comprehensive Metabolic Panel [[id number]] 2)  T-Lipid Profile [80061-22930] 3)  T-Sed Rate (Automated) [52841-32440] 4)  T-PSA Total [[address]] 5)  T-CBC No Diff [[id number]] 6)  Est. Patient Level IV [44034]

## 2010-07-21 ENCOUNTER — Telehealth: Payer: Self-pay | Admitting: Family Medicine

## 2010-07-25 NOTE — Progress Notes (Signed)
Summary: Proair refill Atirvastatin not avail to pickup?  Phone Note Refill Request Message from:  Patient on July 21, 2010 2:24 PM  Refills Requested: Medication #1:  PROAIR HFA 108 (90 BASE) MCG/ACT AERS 2- 4 puffs inhaled eveyr 4-6 hours as needed for shortness of breath..   Dosage confirmed as above?Dosage Confirmed   Brand Name Necessary? No   Supply Requested: 1 month pt states Atirvastatin was not avail to pick up, pls make sure pharmacy has received   Method Requested: Electronic Next Appointment Scheduled: 09/18/10 Initial call taken by: Lannette Donath,  July 21, 2010 2:25 PM  Follow-up for Phone Call        [facility] at [facility] and ProAir has been there since 3/2, and lipitor was called in yesterday early AM.  They have had prescriptions ready for him to pick up.  I spoke to pt and informed him that prescriptions are ready for him at the pharmacy. Follow-up by: Francee Piccolo CMA Duncan Dull),  July 21, 2010 6:09 PM

## 2010-09-15 ENCOUNTER — Encounter: Payer: Self-pay | Admitting: Family Medicine

## 2010-09-18 ENCOUNTER — Encounter: Payer: Medicare Other | Admitting: Family Medicine

## 2010-09-22 NOTE — Op Note (Signed)
NAMEVALTON, [name]               ACCOUNT NO.:  [id number]   MEDICAL RECORD NO.:  [id number]          PATIENT TYPE:  OUT   LOCATION:  CARD                         FACILITY:  [facility]   PHYSICIAN:  Casimiro Needle B. Sherene Sires, M.D. [facility] OF BIRTH:  10/15/1945   DATE OF PROCEDURE:  04/05/2004  DATE OF DISCHARGE:                                 OPERATIVE REPORT   PROCEDURE:  Fiberoptic bronchoscopy with Wang biopsy of the right middle  lobe orifice and transbronchial biopsy of the right middle lobe.   HISTORY:  Please see dictated consultation note.  I initially thought this  patient had a lung abscess but has failed to improve with antibiotic therapy  and thought this might be a necrotizing mass related to bronchogenic  carcinoma.  He agreed to the procedure after a full discussion of the risks,  benefits, and alternatives.  Please see dictated office notes for further  information.  Note that there had been no change in the history or exam  since the evaluation was performed.   The procedure was performed in the bronchoscopy suite with continuous  monitoring by surface ECG and oximetry. The patient maintained adequate  saturations throughout the procedure in sinus rhythm.  He received a total  of 25 mg of IV Demerol and 2.5 mg IV Versed for adequate sedation and cough  suppression.   His airways were generally anesthetized with 1% lidocaine by updraft  nebulizer and the right naris additionally prepared with 2% lidocaine jelly.  Using a standard video bronchoscope, the right naris was easily cannulated  with good visualization of the oropharynx and larynx. The cords moved  normally and there were no apparent upper airway lesions.   Using an additional 1% lidocaine as needed, the entire tracheobronchial tree  was explored bilaterally with the following findings.   1.  The trachea, carina and all the left sided airways were normal.  2.  The right upper lobe was normal as was the bronchus  intermedius      proximally.  3.  The abnormality occurred in the distal bronchus intermedius which      appeared heaped up at the level of the orifice of the right middle lobe      obstructing the orifice by approximately 25%.  The orifice itself was      normal as were all the airways of the right middle lobe and all the      right lower lobe airways were normal also.   Initially I approached this heaped up tissue with two Wang biopsies with  adequate control of bleeding.  Additionally I performed transbronchial  biopsies via the right middle lobe but was not sure that we were able to  actually achieve adequate sampling of the mass seen on fluoroscopy as most  of the airways passed either above or below this lesion.   IMPRESSION:  Cavitary right lung mass with extrinsic compression of the  right middle lobe consistent with either reactive or malignant adenopathy.   RECOMMENDATIONS:  1.  Await the following studies as Wang biopsy for cytology.  2.  Transbronchial biopsy  of the right middle lobe for histology.  3.  Bronchial lavage for cytology and also AFB stain and culture.      MBW/MEDQ  D:  04/05/2004  T:  04/05/2004  Job:  [id number]   cc:   Joycelyn Rua, M.D.  [address] [place]  [place] [address]  Fax: [phone]

## 2010-09-22 NOTE — Op Note (Signed)
Tim [name], Tim [name]               ACCOUNT NO.:  [id number]   MEDICAL RECORD NO.:  [id number]          PATIENT TYPE:  AMB   LOCATION:  DSC                          FACILITY:  [facility]   PHYSICIAN:  Cindee Salt, M.D.       DATE OF BIRTH:  [birth year]   DATE OF PROCEDURE:  08/14/2005  DATE OF DISCHARGE:                                 OPERATIVE REPORT   PREOPERATIVE DIAGNOSIS:  Arthritis metacarpophalangeal joint right index  finger.   POSTOPERATIVE DIAGNOSIS:  Arthritis metacarpophalangeal joint right index  finger.   OPERATION:  Synovectomy metacarpophalangeal joint right index finger.   SURGEON:  Cindee Salt, M.D.   ASSISTANT:  Carolyne Fiscal R.N.   ANESTHESIA:  General.   HISTORY:  The patient is a 65 year old male with a history of massive  swelling of the index finger, metacarpophalangeal joint right hand.  This  has not responded to conservative treatment.  He is admitted now for  synovectomy.   DESCRIPTION OF PROCEDURE:  The patient was brought to the operating room  where a general anesthetic was carried out without difficulty.  He was  prepped using DuraPrep, in the supine position, and the right arm free.  The  limb was exsanguinated with an Esmarch bandage, tourniquet placed high on  the arm; was then inflated to 250 mmHg.  An incision was made, curvilinear,  over the dorsal ulnar aspect of the metacarpophalangeal joint, and carried  down through subcutaneous tissue.  Bleeders were electrocauterized.  Neurovascular structures were identified and protected.  Massive swelling in  the metacarpophalangeal joint; and synovial tissue was immediately apparent.  The sagittal fibers on the ulnar aspect were incised.  The joint was then  opened and significant loose material was present.  With blunt sharp  dissection, a synovectomy was performed including both dorsal and palmar  aspects.  Specimen was sent to pathology for both formalin and saline.  Cultures were taken for both aerobic,  anaerobic, fungal and TB cultures.   The wound was then copiously irrigated with saline.  The remainder of the  capsule and sagittal fibers were repaired with figure-of-eight 4-0 [place]  sutures.  The skin was repaired with interrupted 5-0 nylon sutures.  Significant changes were present in the metacarpal head with erosions about  the collateral ligaments and several areas of erosion into the bone itself,  but cartilage appeared to be present.  A sterile compressive dressing and  splint was applied.  The patient tolerated the procedure well; and was taken  to the recovery room for observation in satisfactory condition.  He is  discharged home to return to the [facility] in 1 week on  Vicodin.          ______________________________  Cindee Salt, M.D.    GK/MEDQ  D:  08/14/2005  T:  08/14/2005  Job:  [id number]

## 2010-10-19 ENCOUNTER — Other Ambulatory Visit: Payer: Self-pay | Admitting: Family Medicine

## 2010-12-13 ENCOUNTER — Encounter: Payer: Self-pay | Admitting: Family Medicine

## 2010-12-13 ENCOUNTER — Telehealth: Payer: Self-pay | Admitting: Family Medicine

## 2010-12-13 ENCOUNTER — Ambulatory Visit (INDEPENDENT_AMBULATORY_CARE_PROVIDER_SITE_OTHER): Payer: Medicare Other | Admitting: Family Medicine

## 2010-12-13 VITALS — BP 115/69 | HR 71 | Temp 98.4°F | Wt 156.0 lb

## 2010-12-13 DIAGNOSIS — J441 Chronic obstructive pulmonary disease with (acute) exacerbation: Secondary | ICD-10-CM

## 2010-12-13 DIAGNOSIS — J189 Pneumonia, unspecified organism: Secondary | ICD-10-CM

## 2010-12-13 MED ORDER — PREDNISONE 20 MG PO TABS: ORAL_TABLET | ORAL | Status: DC

## 2010-12-13 MED ORDER — ALBUTEROL SULFATE HFA 108 (90 BASE) MCG/ACT IN AERS: 2.0000 | INHALATION_SPRAY | RESPIRATORY_TRACT | Status: DC | PRN

## 2010-12-13 MED ORDER — CEFTRIAXONE SODIUM 1 G IJ SOLR
1.0000 g | Freq: Once | INTRAMUSCULAR | Status: AC
  Administered 2010-12-13: 1 g via INTRAMUSCULAR

## 2010-12-13 MED ORDER — AZITHROMYCIN 250 MG PO TABS: ORAL_TABLET | ORAL | Status: DC

## 2010-12-13 NOTE — Telephone Encounter (Signed)
Pt's wife called and said pt in [place] on trip and had to go to the walk in clinic last Friday.  Dx walking pneumonia and emphysema.  Went back on Sunday and got fluids and the clinic wanted to hospitalize, but pt would not go.  Pt having trouble breathing.   Plan:  Spoke with Dr. Cathey Endow to get auth to put pt in at 4:00pm today on her sched.  Okayed.  Told pt's wife to call the clinic pt was seen at in [place] and have them fax records to our office before he comes.  Fax number given. Jarvis Newcomer, LPN Domingo Dimes

## 2010-12-13 NOTE — Patient Instructions (Signed)
HOLD METHOTREXATE FOR THE NEXT WEEK.  ROCEPHIN INJECTION TODAY + ZITHROMAX X 5 DAYS (STOP AUGMENTIN)  AVOID SMOKING  USE PROAIR INHALER 2 PUFFS EVERY 2-4 HRS FOR THE NEXT WEEK.  USE OTC MUCINEX 2 TABS 2 X A DAY FOR MUCOUS CHEST CONGESTION.  RETURN FOR F/U COPD EXACERBATION Monday.

## 2010-12-13 NOTE — Progress Notes (Signed)
  Subjective:    Patient ID: Tim [name], male    DOB: June 09, 1945, 65 y.o.   MRN: [id number]  HPI  65 yo WM presents for not feeling well x 3 wks.  He was out of [place] with work and started to have more SOB.  He went to [facility] for the cough and SOB in [place].  CXR was + for COPD changes.  He is a long time smoker.  He is feeling worse, on Augmentin x 2 days and nothing else.  Coughing a lot, has chest tightness, low grade fever and SOB.   BP 115/69  Pulse 71  Temp(Src) 98.4 F (36.9 C) (Oral)  Wt 156 lb (70.761 kg)  SpO2 92%    Review of Systems  Constitutional: Positive for chills, appetite change and fatigue. Negative for fever.  HENT: Positive for congestion. Negative for sore throat and rhinorrhea.   Eyes: Negative for itching.  Respiratory: Positive for cough, chest tightness, shortness of breath and wheezing.   Cardiovascular: Negative for chest pain.  Gastrointestinal: Positive for nausea. Negative for vomiting, abdominal pain and diarrhea.  Musculoskeletal: Negative for back pain.  Skin: Negative for rash.  Neurological: Negative for light-headedness and headaches.       Objective:   Physical Exam  Constitutional: He appears well-developed.  HENT:  Head: Normocephalic and atraumatic.  Mouth/Throat: Oropharynx is clear and moist.  Eyes: No scleral icterus.       Conjunctiva injected mildly  Neck: Neck supple.  Cardiovascular: Normal rate, regular rhythm and normal heart sounds.   Pulmonary/Chest: He is in respiratory distress (mild). He has wheezes. He has no rales.       Coarse rhonchi bilaterally with exp wheezing  Abdominal: Soft. Bowel sounds are normal. He exhibits no distension. There is no tenderness.  Musculoskeletal: He exhibits no edema.  Lymphadenopathy:    He has cervical adenopathy.  Skin: Skin is warm and dry. No rash noted.  Psychiatric: He has a normal mood and affect.          Assessment & Plan:

## 2010-12-14 DIAGNOSIS — J189 Pneumonia, unspecified organism: Secondary | ICD-10-CM | POA: Insufficient documentation

## 2010-12-14 NOTE — Assessment & Plan Note (Addendum)
augmentin is not helping and is causing some GI distress.  Stop.  Change to Rocephin + zithromax today.  Call if any problems.  Has already had 2 CXRs this wk.  Will proceed with full tx for COPD exac, closely follow O2 sats and ability to breathe.  RTC for f/u Monday.  Hold Mondays' Methotrexate.

## 2010-12-14 NOTE — Assessment & Plan Note (Signed)
COPD exacerbation secondary to pneumonia.  [place] and [place] had a steroid injection 2 days ago.   Will start Prednisone 60 mg/ day x 5 days today, will change antibiotics to Rocephin + Zithromax today, add Mucinex 2 tabs po bid for chest congestion and RX for Albuterol HFA given to use 4-6 x a day for the next wk.  Avoid smoking.  If worsening, advised to go to the ED.  RTC for f/u Monday.

## 2010-12-18 ENCOUNTER — Encounter: Payer: Self-pay | Admitting: Family Medicine

## 2010-12-18 ENCOUNTER — Ambulatory Visit (INDEPENDENT_AMBULATORY_CARE_PROVIDER_SITE_OTHER): Payer: Medicare Other | Admitting: Family Medicine

## 2010-12-18 DIAGNOSIS — K838 Other specified diseases of biliary tract: Secondary | ICD-10-CM

## 2010-12-18 DIAGNOSIS — J441 Chronic obstructive pulmonary disease with (acute) exacerbation: Secondary | ICD-10-CM

## 2010-12-18 DIAGNOSIS — J189 Pneumonia, unspecified organism: Secondary | ICD-10-CM

## 2010-12-18 MED ORDER — ATORVASTATIN CALCIUM 80 MG PO TABS: 80.0000 mg | ORAL_TABLET | Freq: Every day | ORAL | Status: DC

## 2010-12-18 MED ORDER — FOLIC ACID 1 MG PO TABS: 1.0000 mg | ORAL_TABLET | Freq: Every day | ORAL | Status: DC

## 2010-12-18 NOTE — Progress Notes (Signed)
Subjective:    Patient ID: Tim [name], male    DOB: November 27, 1945, 65 y.o.   MRN: [id number]  HPI He is here to followup for COPD and pneumonia. It sounds like he has had 3 rounds of antibiotics. He just completed his antibiotics yesterday. He was also put on a course of steroids and he finished his last tablet yesterday. He did note the albuterol HFA that he was given work very well. He does notice symptom relief when he uses it.  He would not have to go to the emergency department.  Overall he feels better.  Still cough, but he feels his MS back to baseline. he has a history of a chronic cough.  No fever. Cough is more dry now. He says he feels almost back to normal.  He says he stopped smoking about a week ago. He is here today with his wife. No fevers.   Review of Systems     BP 105/64  Pulse 80  Temp(Src) 98.4 F (36.9 C) (Oral)  Wt 157 lb (71.215 kg)  SpO2 96%    No Known Allergies  Past Medical History  Diagnosis Date  . TB (pulmonary tuberculosis)     hx of tb  . Arthritis     RA  . COPD (chronic obstructive pulmonary disease)     Dr. Vassie Loll    Past Surgical History  Procedure Date  . Wrist surgery     L wrist surgery 1984 & 2010  . R mcp     2006  . Bronchoscopy     for scarred lung 2002 from TB    History   Social History  . Marital Status: Married    Spouse Name: N/A    Number of Children: N/A  . Years of Education: N/A   Occupational History  . Not on file.   Social History Main Topics  . Smoking status: Current Everyday Smoker -- 1.5 packs/day  . Smokeless tobacco: Not on file  . Alcohol Use: No  . Drug Use: No  . Sexually Active:    Other Topics Concern  . Not on file   Social History Narrative   He is originally from [place].    Family History  Problem Relation Age of Onset  . Cancer Mother     cervical cancer in 34  . Arthritis Father   . Stroke Brother     AMI  . Cancer Brother     prostate    Mr. Geesey had no medications  administered during this visit.  Objective:   Physical Exam  Constitutional: He is oriented to person, place, and time. He appears well-developed and well-nourished.       Very thin male  HENT:  Head: Normocephalic and atraumatic.  Neck: Neck supple. No thyromegaly present.  Cardiovascular: Normal rate, regular rhythm and normal heart sounds.   Pulmonary/Chest: Effort normal.       Prolonged expiration bilaterally. No wheezing or crackles or Rhonchi  Lymphadenopathy:    He has no cervical adenopathy.  Neurological: He is alert and oriented to person, place, and time.  Skin: Skin is warm and dry.  Psychiatric: He has a normal mood and affect. His behavior is normal.          Assessment & Plan:  Pneumonia with COPD exacerbation-significantly improved. Almost at baseline. I recommended he follow up in about one month and he's feeling significantly better for spirometry so that we can further stage his COPD. Patient is  to call if he suddenly gets worse. He can use his albuterol inhaler as needed.

## 2010-12-18 NOTE — Patient Instructions (Signed)
Follow up for spirometry  In about 3-4 weeks.

## 2010-12-19 ENCOUNTER — Encounter: Payer: Self-pay | Admitting: Family Medicine

## 2011-04-17 ENCOUNTER — Other Ambulatory Visit: Payer: Self-pay | Admitting: *Deleted

## 2011-04-17 MED ORDER — ALBUTEROL SULFATE HFA 108 (90 BASE) MCG/ACT IN AERS: 2.0000 | INHALATION_SPRAY | RESPIRATORY_TRACT | Status: AC | PRN

## 2012-02-21 ENCOUNTER — Other Ambulatory Visit: Payer: Self-pay | Admitting: *Deleted

## 2012-02-21 MED ORDER — ATORVASTATIN CALCIUM 80 MG PO TABS: 80.0000 mg | ORAL_TABLET | Freq: Every day | ORAL | Status: DC

## 2012-06-11 ENCOUNTER — Other Ambulatory Visit: Payer: Self-pay | Admitting: Family Medicine

## 2012-06-11 NOTE — Telephone Encounter (Signed)
Patient needs appointment and lab work before refills

## 2013-05-11 DIAGNOSIS — M25569 Pain in unspecified knee: Secondary | ICD-10-CM | POA: Diagnosis not present

## 2013-05-11 DIAGNOSIS — R1013 Epigastric pain: Secondary | ICD-10-CM | POA: Diagnosis not present

## 2013-05-11 DIAGNOSIS — J449 Chronic obstructive pulmonary disease, unspecified: Secondary | ICD-10-CM | POA: Diagnosis not present

## 2013-05-13 ENCOUNTER — Other Ambulatory Visit (HOSPITAL_BASED_OUTPATIENT_CLINIC_OR_DEPARTMENT_OTHER): Payer: Self-pay | Admitting: Family Medicine

## 2013-05-13 DIAGNOSIS — R1013 Epigastric pain: Secondary | ICD-10-CM

## 2013-05-13 DIAGNOSIS — J449 Chronic obstructive pulmonary disease, unspecified: Secondary | ICD-10-CM

## 2013-05-14 DIAGNOSIS — J449 Chronic obstructive pulmonary disease, unspecified: Secondary | ICD-10-CM | POA: Diagnosis not present

## 2013-05-14 DIAGNOSIS — Z1389 Encounter for screening for other disorder: Secondary | ICD-10-CM | POA: Diagnosis not present

## 2013-05-14 DIAGNOSIS — R1013 Epigastric pain: Secondary | ICD-10-CM | POA: Diagnosis not present

## 2013-05-15 ENCOUNTER — Ambulatory Visit (HOSPITAL_BASED_OUTPATIENT_CLINIC_OR_DEPARTMENT_OTHER): Payer: Medicare Other

## 2013-05-20 ENCOUNTER — Other Ambulatory Visit (HOSPITAL_BASED_OUTPATIENT_CLINIC_OR_DEPARTMENT_OTHER): Payer: Self-pay | Admitting: Family Medicine

## 2013-05-20 ENCOUNTER — Ambulatory Visit (HOSPITAL_BASED_OUTPATIENT_CLINIC_OR_DEPARTMENT_OTHER)
Admission: RE | Admit: 2013-05-20 | Discharge: 2013-05-20 | Disposition: A | Discharge status: Home / Self Care | Payer: Medicare Other | Source: Ambulatory Visit | Attending: Family Medicine | Admitting: Family Medicine

## 2013-05-20 DIAGNOSIS — R0609 Other forms of dyspnea: Secondary | ICD-10-CM | POA: Insufficient documentation

## 2013-05-20 DIAGNOSIS — Z8611 Personal history of tuberculosis: Secondary | ICD-10-CM | POA: Insufficient documentation

## 2013-05-20 DIAGNOSIS — R1013 Epigastric pain: Secondary | ICD-10-CM

## 2013-05-20 DIAGNOSIS — J449 Chronic obstructive pulmonary disease, unspecified: Secondary | ICD-10-CM

## 2013-05-20 DIAGNOSIS — I2510 Atherosclerotic heart disease of native coronary artery without angina pectoris: Secondary | ICD-10-CM | POA: Insufficient documentation

## 2013-05-20 DIAGNOSIS — Q278 Other specified congenital malformations of peripheral vascular system: Secondary | ICD-10-CM | POA: Insufficient documentation

## 2013-05-20 DIAGNOSIS — J479 Bronchiectasis, uncomplicated: Secondary | ICD-10-CM | POA: Diagnosis not present

## 2013-05-20 DIAGNOSIS — J4489 Other specified chronic obstructive pulmonary disease: Secondary | ICD-10-CM | POA: Insufficient documentation

## 2013-05-20 DIAGNOSIS — R0600 Dyspnea, unspecified: Secondary | ICD-10-CM

## 2013-05-20 DIAGNOSIS — R0989 Other specified symptoms and signs involving the circulatory and respiratory systems: Secondary | ICD-10-CM | POA: Insufficient documentation

## 2013-05-20 DIAGNOSIS — K5730 Diverticulosis of large intestine without perforation or abscess without bleeding: Secondary | ICD-10-CM | POA: Diagnosis not present

## 2013-05-20 DIAGNOSIS — Q254 Congenital malformation of aorta unspecified: Secondary | ICD-10-CM | POA: Insufficient documentation

## 2013-05-20 DIAGNOSIS — K4090 Unilateral inguinal hernia, without obstruction or gangrene, not specified as recurrent: Secondary | ICD-10-CM | POA: Diagnosis not present

## 2013-05-20 MED ORDER — IOHEXOL 300 MG/ML  SOLN
100.0000 mL | Freq: Once | INTRAMUSCULAR | Status: AC | PRN
Start: 2013-05-20 — End: 2013-05-20
  Administered 2013-05-20: 100 mL via INTRAVENOUS

## 2013-06-01 ENCOUNTER — Encounter: Payer: Self-pay | Admitting: *Deleted

## 2013-06-01 DIAGNOSIS — F17200 Nicotine dependence, unspecified, uncomplicated: Secondary | ICD-10-CM | POA: Diagnosis not present

## 2013-06-01 DIAGNOSIS — J441 Chronic obstructive pulmonary disease with (acute) exacerbation: Secondary | ICD-10-CM | POA: Diagnosis not present

## 2013-06-01 DIAGNOSIS — R0902 Hypoxemia: Secondary | ICD-10-CM | POA: Diagnosis not present

## 2013-06-02 ENCOUNTER — Other Ambulatory Visit: Payer: Self-pay | Admitting: *Deleted

## 2013-06-02 ENCOUNTER — Encounter (INDEPENDENT_AMBULATORY_CARE_PROVIDER_SITE_OTHER): Payer: Self-pay

## 2013-06-02 ENCOUNTER — Encounter: Payer: Self-pay | Admitting: Cardiovascular Disease

## 2013-06-02 ENCOUNTER — Encounter: Payer: Self-pay | Admitting: Cardiology

## 2013-06-02 ENCOUNTER — Ambulatory Visit (INDEPENDENT_AMBULATORY_CARE_PROVIDER_SITE_OTHER): Payer: Medicare Other | Admitting: Cardiovascular Disease

## 2013-06-02 VITALS — BP 144/66 | HR 63 | Ht 72.0 in | Wt 177.0 lb

## 2013-06-02 DIAGNOSIS — M1710 Unilateral primary osteoarthritis, unspecified knee: Secondary | ICD-10-CM | POA: Diagnosis not present

## 2013-06-02 DIAGNOSIS — I714 Abdominal aortic aneurysm, without rupture, unspecified: Secondary | ICD-10-CM

## 2013-06-02 DIAGNOSIS — E785 Hyperlipidemia, unspecified: Secondary | ICD-10-CM

## 2013-06-02 DIAGNOSIS — M25569 Pain in unspecified knee: Secondary | ICD-10-CM | POA: Diagnosis not present

## 2013-06-02 DIAGNOSIS — R079 Chest pain, unspecified: Secondary | ICD-10-CM | POA: Diagnosis not present

## 2013-06-02 DIAGNOSIS — J4489 Other specified chronic obstructive pulmonary disease: Secondary | ICD-10-CM

## 2013-06-02 DIAGNOSIS — F17200 Nicotine dependence, unspecified, uncomplicated: Secondary | ICD-10-CM | POA: Diagnosis not present

## 2013-06-02 DIAGNOSIS — I2510 Atherosclerotic heart disease of native coronary artery without angina pectoris: Secondary | ICD-10-CM | POA: Diagnosis not present

## 2013-06-02 DIAGNOSIS — J449 Chronic obstructive pulmonary disease, unspecified: Secondary | ICD-10-CM | POA: Diagnosis not present

## 2013-06-02 DIAGNOSIS — I25810 Atherosclerosis of coronary artery bypass graft(s) without angina pectoris: Secondary | ICD-10-CM | POA: Diagnosis not present

## 2013-06-02 DIAGNOSIS — IMO0002 Reserved for concepts with insufficient information to code with codable children: Secondary | ICD-10-CM | POA: Diagnosis not present

## 2013-06-02 NOTE — Assessment & Plan Note (Signed)
Reviewed CT and was not that impressed with the density of calcification seen.  He clearly has some vascular changes from smoking as evidenced by ectatic aorta with calcification in the descending thoracic aorta as well.  Favor functional study to r/o flow limiting lesion Activity  Limited by dyspnea and COPD

## 2013-06-02 NOTE — Assessment & Plan Note (Signed)
Cholesterol is at goal.  Continue current dose of statin and diet Rx.  No myalgias or side effects.  F/U  LFT's in 6 months. Lab Results  Component Value Date   LDLCALC 72 07/10/2010

## 2013-06-02 NOTE — Assessment & Plan Note (Signed)
Continue oxygen and inhalers  He is willing to start welbutrin  F/U pulmonary in [place]

## 2013-06-02 NOTE — Patient Instructions (Signed)
Your physician has requested that you have a lexiscan myoview. For further information please visit [email or web address]. Please follow instruction sheet, as given.  Your physician recommends that you continue on your current medications as directed. Please refer to the Current Medication list given to you today.  Your physician wants you to follow-up in: 1 year with Dr. Nishan.  You will receive a reminder letter in the mail two months in advance. If you don't receive a letter, please call our office to schedule the follow-up appointment.  

## 2013-06-02 NOTE — Assessment & Plan Note (Signed)
Reviewed CT no AAA just ecatsia wit diameter less than 2.5 cm

## 2013-06-02 NOTE — Progress Notes (Signed)
Patient ID: Tim [name], male   DOB: Sep 04, 1945, 68 y.o.   MRN: [id number] 68 yo [place] male referred for dyspnea and abnormal chest CT.  Has oxygen dependant COPD.  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  He has no history of CAD.  No chest pain Marked exertional dyspnea with desats on limited activity.  Thinks he had a normal myovue in 1999.  Discussed smoking cessation.  He did not tolerate Chantix  Willing to take Welbutrin and nicotine replacement Mild cough.      ROS: Denies fever, malais, weight loss, blurry vision, decreased visual acuity, cough, sputum, SOB, hemoptysis, pleuritic pain, palpitaitons, heartburn, abdominal pain, melena, lower extremity edema, claudication, or rash.  All other systems reviewed and negative   General: Affect appropriate Thin cachectic finnish male  HEENT: normal Neck supple with no adenopathy JVP normal no bruits no thyromegaly Lungs poor airway movement  no wheezing and good diaphragmatic motion Heart:  S1/S2 no murmur,rub, gallop or click PMI normal Abdomen: benighn, BS positve, no tenderness, no AAA no bruit.  No HSM or HJR Distal pulses intact with no bruits No edema Neuro non-focal Skin warm and dry No muscular weakness  Medications Current Outpatient Prescriptions  Medication Sig Dispense Refill  . albuterol (PROAIR HFA) 108 (90 BASE) MCG/ACT inhaler Inhale 2 puffs into the lungs every 4 (four) hours as needed for wheezing or shortness of breath.  1 Inhaler  1  . atorvastatin (LIPITOR) 80 MG tablet Take 1 tablet (80 mg total) by mouth daily.  90 tablet  0  . docusate sodium (COLACE) 100 MG capsule Take 100 mg by mouth 2 (two) times daily.      Marland Kitchen DOXYCYCLINE HYCLATE PO Take by mouth daily.      . folic acid (FOLVITE) 1 MG tablet Take 1 tablet (1 mg total) by mouth daily.  90 tablet  1  . levofloxacin (LEVAQUIN) 500 MG tablet Take 500 mg by mouth daily.       . methotrexate (RHEUMATREX) 2.5 MG tablet Take 2.5 mg by mouth once a week. Caution:Chemotherapy. Protect from light.       Marland Kitchen omeprazole (PRILOSEC) 40 MG capsule Take 40 mg by mouth daily.       . Potassium Gluconate 595 MG CAPS Take by mouth daily.      . predniSONE (DELTASONE) 20 MG tablet 3 tabs po once daily x 5 days  15 tablet  0   No current facility-administered medications for this visit.    Allergies Review of patient's allergies indicates no known allergies.  Family History: Family History  Problem Relation Age of Onset  . Cancer Mother     cervical cancer in 78  . Arthritis Father   . Stroke Brother     AMI  . Cancer Brother     prostate    Social History: History   Social History  . Marital Status: Married    Spouse Name: N/A    Number of Children: N/A  . Years of Education: N/A   Occupational History  . Not on file.   Social History Main Topics  . Smoking status: Current Every Day Smoker -- 1.50 packs/day  . Smokeless tobacco: Not on file  . Alcohol Use: No  . Drug Use: No  . Sexual Activity:    Other Topics Concern  . Not on file   Social History Narrative  He is originally from [place].    Electrocardiogram:SR rate 73  LAD LVH   Assessment and Plan

## 2013-06-17 ENCOUNTER — Encounter: Payer: Self-pay | Admitting: Cardiology

## 2013-06-22 ENCOUNTER — Ambulatory Visit (HOSPITAL_COMMUNITY): Payer: Medicare Other | Attending: Cardiovascular Disease | Admitting: Radiology

## 2013-06-22 ENCOUNTER — Encounter: Payer: Self-pay | Admitting: Cardiovascular Disease

## 2013-06-22 VITALS — BP 116/78 | HR 71 | Ht 72.0 in | Wt 175.0 lb

## 2013-06-22 DIAGNOSIS — R0989 Other specified symptoms and signs involving the circulatory and respiratory systems: Secondary | ICD-10-CM | POA: Insufficient documentation

## 2013-06-22 DIAGNOSIS — I2510 Atherosclerotic heart disease of native coronary artery without angina pectoris: Secondary | ICD-10-CM | POA: Diagnosis not present

## 2013-06-22 DIAGNOSIS — R0609 Other forms of dyspnea: Secondary | ICD-10-CM | POA: Insufficient documentation

## 2013-06-22 DIAGNOSIS — I739 Peripheral vascular disease, unspecified: Secondary | ICD-10-CM | POA: Insufficient documentation

## 2013-06-22 DIAGNOSIS — R079 Chest pain, unspecified: Secondary | ICD-10-CM | POA: Diagnosis not present

## 2013-06-22 DIAGNOSIS — R0602 Shortness of breath: Secondary | ICD-10-CM | POA: Diagnosis not present

## 2013-06-22 DIAGNOSIS — I25810 Atherosclerosis of coronary artery bypass graft(s) without angina pectoris: Secondary | ICD-10-CM

## 2013-06-22 MED ORDER — TECHNETIUM TC 99M SESTAMIBI GENERIC - CARDIOLITE
30.0000 | Freq: Once | INTRAVENOUS | Status: AC | PRN
  Administered 2013-06-22: 30 via INTRAVENOUS

## 2013-06-22 MED ORDER — REGADENOSON 0.4 MG/5ML IV SOLN
0.4000 mg | Freq: Once | INTRAVENOUS | Status: AC
  Administered 2013-06-22: 0.4 mg via INTRAVENOUS

## 2013-06-22 MED ORDER — TECHNETIUM TC 99M SESTAMIBI GENERIC - CARDIOLITE
10.0000 | Freq: Once | INTRAVENOUS | Status: AC | PRN
  Administered 2013-06-22: 10 via INTRAVENOUS

## 2013-06-22 NOTE — Progress Notes (Signed)
[facility] SITE 3 NUCLEAR MED [address] [place], [place] [address] [phone]    Cardiology Nuclear Med Study  Tim [name] is a 69 y.o. male     MRN : [id number]     DOB: Sep 25, 1945  Procedure Date: 06/22/2013  Nuclear Med Background Indication for Stress Test:  Evaluation for Ischemia History:  CAD, MPI 2010 (apical thinning) EF 52%, Abn. Chest CT, COPD  Cardiac Risk Factors: PVD  Symptoms:  Chest Pain, DOE and SOB   Nuclear Pre-Procedure Caffeine/Decaff Intake:  None NPO After: 10:00pm   Lungs:  Wheezing/tight O2 Sat: 99% on room air. IV 0.9% NS with Angio Cath:  22g  IV Site: R Hand  IV Started by:  Bonnita Levan, RN  Chest Size (in):  40 Cup Size: n/a  Height: 6' (1.829 m)  Weight:  175 lb (79.379 kg)  BMI:  Body mass index is 23.73 kg/(m^2). Tech Comments:  N/A    Nuclear Med Study 1 or 2 day study: 1 day  Stress Test Type:  Lexiscan  Reading MD: N/A  Order Authorizing Provider:  Charlton Haws, MD  Resting Radionuclide: Technetium 65m Sestamibi  Resting Radionuclide Dose: 11.0 mCi   Stress Radionuclide:  Technetium 81m Sestamibi  Stress Radionuclide Dose: 33.0 mCi           Stress Protocol Rest HR: 71 Stress HR: 93  Rest BP: 116/78 Stress BP: 123/73  Exercise Time (min): n/a METS: n/a           Dose of Adenosine (mg):  n/a Dose of Lexiscan: 0.4 mg  Dose of Atropine (mg): n/a Dose of Dobutamine: n/a mcg/kg/min (at max HR)  Stress Test Technologist: Milana Na, EMT-P  Nuclear Technologist:  Harlow Asa, CNMT     Rest Procedure:  Myocardial perfusion imaging was performed at rest 45 minutes following the intravenous administration of Technetium 51m Sestamibi. Rest ECG: NSR-LVH and NSR-iLBBB  Stress Procedure:  The patient received IV Lexiscan 0.4 mg over 15-seconds.  Technetium 33m Sestamibi injected at 30-seconds.  Quantitative spect images were obtained after a 45 minute delay.  The patient complained of SOB and abdominal pain  during the infusion of Lexiscan.  Stress ECG: No significant change from baseline ECG  QPS Raw Data Images:  There is interference from nuclear activity from structures below the diaphragm. This does not affect the ability to read the study. Stress Images:  There is a large perfusion defect in the basal and mid inferoseptal, inferior, inferolateral, basal anterolateral and in the true apex.  Rest Images:  There is a large perfusion defect in the basal and mid inferoseptal, inferior, basal inferolateral and anterolateral, apical lateral walls and in the true apex. (SDS 2) Subtraction (SDS):  Minimal reversibility in the basal inferolateral wall.  Transient Ischemic Dilatation (Normal <1.22):  1.01 Lung/Heart Ratio (Normal <0.45):  0.38  Quantitative Gated Spect Images QGS EDV:  120 ml QGS ESV:  60 ml  Impression Exercise Capacity:  Lexiscan with no exercise. BP Response:  Normal blood pressure response. Clinical Symptoms:  There is dyspnea. ECG Impression:  No significant ST segment change suggestive of ischemia. Comparison with Prior Nuclear Study: There is significant change when compared to the prior study from 2010.  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 A viability study with cardiac MRI is recommended. .  LV Ejection Fraction: 45%.  LV Wall Motion:  LVEF is mildly decreased. There is  hypokinesis of the entire inferoseptal, inferior and inferolateral walls.   Tobias Alexander, Rexene Edison 06/22/2013

## 2013-06-24 ENCOUNTER — Other Ambulatory Visit: Payer: Self-pay | Admitting: *Deleted

## 2013-06-24 ENCOUNTER — Encounter: Payer: Self-pay | Admitting: *Deleted

## 2013-06-24 DIAGNOSIS — R0602 Shortness of breath: Secondary | ICD-10-CM

## 2013-06-24 DIAGNOSIS — Z01812 Encounter for preprocedural laboratory examination: Secondary | ICD-10-CM

## 2013-06-24 DIAGNOSIS — Z79899 Other long term (current) drug therapy: Secondary | ICD-10-CM

## 2013-06-26 ENCOUNTER — Other Ambulatory Visit: Payer: Self-pay | Admitting: Cardiovascular Disease

## 2013-06-26 ENCOUNTER — Encounter (HOSPITAL_COMMUNITY): Payer: Self-pay | Admitting: Pharmacy Technician

## 2013-06-26 DIAGNOSIS — R079 Chest pain, unspecified: Secondary | ICD-10-CM

## 2013-06-29 ENCOUNTER — Other Ambulatory Visit (INDEPENDENT_AMBULATORY_CARE_PROVIDER_SITE_OTHER): Payer: Medicare Other

## 2013-06-29 ENCOUNTER — Ambulatory Visit (INDEPENDENT_AMBULATORY_CARE_PROVIDER_SITE_OTHER)
Admission: RE | Admit: 2013-06-29 | Discharge: 2013-06-29 | Disposition: A | Discharge status: Home / Self Care | Payer: Medicare Other | Source: Ambulatory Visit | Attending: Cardiovascular Disease | Admitting: Cardiovascular Disease

## 2013-06-29 DIAGNOSIS — Z01812 Encounter for preprocedural laboratory examination: Secondary | ICD-10-CM | POA: Diagnosis not present

## 2013-06-29 DIAGNOSIS — R0602 Shortness of breath: Secondary | ICD-10-CM | POA: Diagnosis not present

## 2013-06-29 DIAGNOSIS — Z79899 Other long term (current) drug therapy: Secondary | ICD-10-CM

## 2013-06-29 DIAGNOSIS — J439 Emphysema, unspecified: Secondary | ICD-10-CM | POA: Diagnosis not present

## 2013-06-29 LAB — BASIC METABOLIC PANEL
BUN: 14 mg/dL (ref 6–23)
CALCIUM: 9.1 mg/dL (ref 8.4–10.5)
CO2: 29 meq/L (ref 19–32)
Chloride: 104 mEq/L (ref 96–112)
Creatinine, Ser: 0.7 mg/dL (ref 0.4–1.5)
GFR: 113.21 mL/min (ref >60.00)
Glucose, Bld: 81 mg/dL (ref 70–99)
POTASSIUM: 3.1 meq/L — AB (ref 3.5–5.1)
SODIUM: 142 meq/L (ref 135–145)

## 2013-06-29 LAB — CBC WITH DIFFERENTIAL/PLATELET
BASOS ABS: 0 10*3/uL (ref 0.0–0.1)
Basophils Relative: 0.4 % (ref 0.0–3.0)
Eosinophils Absolute: 0.2 10*3/uL (ref 0.0–0.7)
Eosinophils Relative: 1.9 % (ref 0.0–5.0)
HCT: 46.5 % (ref 39.0–52.0)
Hemoglobin: 14.6 g/dL (ref 13.0–17.0)
LYMPHS PCT: 34.1 % (ref 12.0–46.0)
Lymphs Abs: 3.5 10*3/uL (ref 0.7–4.0)
MCHC: 31.5 g/dL (ref 30.0–36.0)
MCV: 96.8 fl (ref 78.0–100.0)
Monocytes Absolute: 0.9 10*3/uL (ref 0.1–1.0)
Monocytes Relative: 8.4 % (ref 3.0–12.0)
NEUTROS PCT: 55.2 % (ref 43.0–77.0)
Neutro Abs: 5.6 10*3/uL (ref 1.4–7.7)
Platelets: 250 10*3/uL (ref 150.0–400.0)
RBC: 4.8 Mil/uL (ref 4.22–5.81)
RDW: 14.3 % (ref 11.5–14.6)
WBC: 10.2 10*3/uL (ref 4.5–10.5)

## 2013-06-29 LAB — PROTIME-INR
INR: 1 ratio (ref 0.8–1.0)
Prothrombin Time: 10.4 s (ref 10.2–12.4)

## 2013-06-30 ENCOUNTER — Other Ambulatory Visit: Payer: Self-pay | Admitting: *Deleted

## 2013-06-30 ENCOUNTER — Telehealth: Payer: Self-pay | Admitting: Cardiovascular Disease

## 2013-06-30 DIAGNOSIS — E876 Hypokalemia: Secondary | ICD-10-CM

## 2013-06-30 MED ORDER — POTASSIUM CHLORIDE ER 10 MEQ PO TBCR: 10.0000 meq | EXTENDED_RELEASE_TABLET | Freq: Every day | ORAL | Status: DC

## 2013-06-30 NOTE — Telephone Encounter (Signed)
New message  Patient needs all information prior to surgery, please call and advise.  Patients email:  [email or web address]

## 2013-06-30 NOTE — Telephone Encounter (Signed)
REVIEWED WITH  WIFE  .Zack Seal

## 2013-07-03 ENCOUNTER — Ambulatory Visit (HOSPITAL_COMMUNITY)
Admission: RE | Admit: 2013-07-03 | Discharge: 2013-07-03 | Disposition: A | Discharge status: Home / Self Care | Payer: Medicare Other | Source: Ambulatory Visit | Attending: Cardiovascular Disease | Admitting: Cardiovascular Disease

## 2013-07-03 ENCOUNTER — Encounter (HOSPITAL_COMMUNITY)
Admission: RE | Disposition: A | Discharge status: Home / Self Care | Payer: Self-pay | Source: Ambulatory Visit | Attending: Cardiovascular Disease

## 2013-07-03 DIAGNOSIS — Z8611 Personal history of tuberculosis: Secondary | ICD-10-CM | POA: Insufficient documentation

## 2013-07-03 DIAGNOSIS — Z79899 Other long term (current) drug therapy: Secondary | ICD-10-CM | POA: Insufficient documentation

## 2013-07-03 DIAGNOSIS — J449 Chronic obstructive pulmonary disease, unspecified: Secondary | ICD-10-CM | POA: Insufficient documentation

## 2013-07-03 DIAGNOSIS — F17200 Nicotine dependence, unspecified, uncomplicated: Secondary | ICD-10-CM | POA: Diagnosis not present

## 2013-07-03 DIAGNOSIS — R079 Chest pain, unspecified: Secondary | ICD-10-CM

## 2013-07-03 DIAGNOSIS — R0989 Other specified symptoms and signs involving the circulatory and respiratory systems: Secondary | ICD-10-CM | POA: Insufficient documentation

## 2013-07-03 DIAGNOSIS — IMO0002 Reserved for concepts with insufficient information to code with codable children: Secondary | ICD-10-CM | POA: Insufficient documentation

## 2013-07-03 DIAGNOSIS — R0609 Other forms of dyspnea: Secondary | ICD-10-CM | POA: Insufficient documentation

## 2013-07-03 DIAGNOSIS — R9439 Abnormal result of other cardiovascular function study: Secondary | ICD-10-CM | POA: Diagnosis not present

## 2013-07-03 DIAGNOSIS — J4489 Other specified chronic obstructive pulmonary disease: Secondary | ICD-10-CM | POA: Insufficient documentation

## 2013-07-03 DIAGNOSIS — Z9981 Dependence on supplemental oxygen: Secondary | ICD-10-CM | POA: Diagnosis not present

## 2013-07-03 HISTORY — PX: LEFT HEART CATHETERIZATION WITH CORONARY ANGIOGRAM: SHX5451

## 2013-07-03 LAB — POTASSIUM: Potassium: 3.8 mEq/L (ref 3.7–5.3)

## 2013-07-03 SURGERY — LEFT HEART CATHETERIZATION WITH CORONARY ANGIOGRAM
Anesthesia: LOCAL

## 2013-07-03 MED ORDER — SODIUM CHLORIDE 0.9 % IV SOLN
INTRAVENOUS | Status: DC
  Administered 2013-07-03: 1000 mL via INTRAVENOUS

## 2013-07-03 MED ORDER — MIDAZOLAM HCL 2 MG/2ML IJ SOLN
INTRAMUSCULAR | Status: AC
  Filled 2013-07-03: qty 2

## 2013-07-03 MED ORDER — SODIUM CHLORIDE 0.9 % IV SOLN: 250.0000 mL | INTRAVENOUS | Status: DC | PRN

## 2013-07-03 MED ORDER — ASPIRIN 81 MG PO CHEW
81.0000 mg | CHEWABLE_TABLET | ORAL | Status: AC
  Administered 2013-07-03: 81 mg via ORAL

## 2013-07-03 MED ORDER — SODIUM CHLORIDE 0.9 % IJ SOLN: 3.0000 mL | Freq: Two times a day (BID) | INTRAMUSCULAR | Status: DC

## 2013-07-03 MED ORDER — NITROGLYCERIN 0.2 MG/ML ON CALL CATH LAB
INTRAVENOUS | Status: AC
  Filled 2013-07-03: qty 1

## 2013-07-03 MED ORDER — SODIUM CHLORIDE 0.9 % IJ SOLN: 3.0000 mL | INTRAMUSCULAR | Status: DC | PRN

## 2013-07-03 MED ORDER — LIDOCAINE HCL (PF) 1 % IJ SOLN
INTRAMUSCULAR | Status: AC
  Filled 2013-07-03: qty 30

## 2013-07-03 MED ORDER — SODIUM CHLORIDE 0.45 % IV SOLN
INTRAVENOUS | Status: AC
  Administered 2013-07-03: 08:00:00 via INTRAVENOUS

## 2013-07-03 MED ORDER — HEPARIN (PORCINE) IN NACL 2-0.9 UNIT/ML-% IJ SOLN
INTRAMUSCULAR | Status: AC
  Filled 2013-07-03: qty 1500

## 2013-07-03 NOTE — Discharge Instructions (Addendum)
F/U with primary and lung doctors Call Dr Fabio Bering office if any issues with cath sight after d/c  Angiography, Care After  Refer to this sheet in the next few weeks. These instructions provide you with information on caring for yourself after your procedure. Your health care provider may also give you more specific instructions. Your treatment has been planned according to current medical practices, but problems sometimes occur. Call your health care provider if you have any problems or questions after your procedure.  WHAT TO EXPECT AFTER THE PROCEDURE After your procedure, it is typical to have the following sensations:  Minor discomfort or tenderness and a small bump at the catheter insertion site. The bump should usually decrease in size and tenderness within 1 to 2 weeks.  Any bruising will usually fade within 2 to 4 weeks. HOME CARE INSTRUCTIONS   You may need to keep taking blood thinners if they were prescribed for you. Only take over-the-counter or prescription medicines for pain, fever, or discomfort as directed by your health care provider.  Do not apply powder or lotion to the site.  Do not sit in a bathtub, swimming pool, or whirlpool for 5 to 7 days.  You may shower 24 hours after the procedure. Remove the bandage (dressing) and gently wash the site with plain soap and water. Gently pat the site dry.  Inspect the site at least twice daily.  Limit your activity for the first 24 hours. Do not bend, squat, or lift anything over 10 lb (9 kg) or as directed by your health care provider.  Do not drive home if you are discharged the day of the procedure. Have someone else drive you. Follow instructions about when you can drive or return to work. SEEK MEDICAL CARE IF:  You get lightheaded when standing up.  You have drainage (other than a small amount of blood on the dressing).  You have chills.  You have a fever.  You have redness, warmth, swelling, or pain at the  insertion site. SEEK IMMEDIATE MEDICAL CARE IF:   You develop chest pain or shortness of breath, feel faint, or pass out.  You have bleeding, swelling larger than a walnut, or drainage from the catheter insertion site.  You develop pain, discoloration, coldness, or severe bruising in the leg or arm that held the catheter.  You have heavy bleeding from the site. If this happens, hold pressure on the site. MAKE SURE YOU:  Understand these instructions.  Will watch your condition.  Will get help right away if you are not doing well or get worse. Document Released: 11/09/2004 Document Revised: 12/24/2012 Document Reviewed: 09/15/2012 [facility] Patient Information 2014 [place], [place].

## 2013-07-03 NOTE — CV Procedure (Signed)
       Catheterization   Indication:  Chest Pain and Abnormal Myovue  Procedure: After informed consent and clinical "time out" the right groin was prepped and draped in a sterile fashion.  A 5Fr sheath was placed in the right femoral artery using seldinger technique and local lidocaine.  Standard JL4, JR4 and angled pigtail catheters were used to engage the coronary arteries.  Coronary arteries were visualized in orthogonal views using caudal and cranial angulation.  RAO ventriculography was done using 25* cc of contrast.    Medications:   Versed: 1 mg's  Fentanyl: 0 ug's  Coronary Arteries: Left dominant with no anomalies  LM: Normal  LAD: Normal    D1: 20% ostial  D2 Normal  Circumflex: Normal   OM1: Normal  OM2: Normal  OM3: Normal  AV groove provides very small PDA   RCA:  Small nondominant normal  **  Ventriculography: EF: 55% %,  inferobasal  hypokinesis  Hemodynamics:  Aortic Pressure: 125/81 mmHg  LV Pressure: 128/9  mmHg  Impression:  No significant coronary disease EF better on LV gram than myovue.  Prognosis appears limited by lung disease Tolerated procedure well.  Femoral approach used due to abb rant subclavian on CT  Charlton Haws 07/03/2013 8:14 AM

## 2013-07-03 NOTE — H&P (Signed)
68 yo [place] male referred for dyspnea and abnormal chest CT. Has oxygen dependant COPD. History of TB CT done by primary showed extensive LM and LAD calcium Also wit aberant right subclavian and persistant leff SVC. He has no history of CAD. No chest pain Marked exertional dyspnea with desats on limited activity. Thinks he had a normal myovue in 1999. Discussed smoking cessation. He did not tolerate Chantix Willing to take Welbutrin and nicotine replacement Mild cough.  ROS: Denies fever, malais, weight loss, blurry vision, decreased visual acuity, cough, sputum, SOB, hemoptysis, pleuritic pain, palpitaitons, heartburn, abdominal pain, melena, lower extremity edema, claudication, or rash. All other systems reviewed and negative  General:  Affect appropriate  Thin cachectic finnish male  HEENT: normal  Neck supple with no adenopathy  JVP normal no bruits no thyromegaly  Lungs poor airway movement no wheezing and good diaphragmatic motion  Heart: S1/S2 no murmur,rub, gallop or click  PMI normal  Abdomen: benighn, BS positve, no tenderness, no AAA  no bruit. No HSM or HJR  Distal pulses intact with no bruits  No edema  Neuro non-focal  Skin warm and dry  No muscular weakness  Medications  Current Outpatient Prescriptions   Medication  Sig  Dispense  Refill   .  albuterol (PROAIR HFA) 108 (90 BASE) MCG/ACT inhaler  Inhale 2 puffs into the lungs every 4 (four) hours as needed for wheezing or shortness of breath.  1 Inhaler  1   .  atorvastatin (LIPITOR) 80 MG tablet  Take 1 tablet (80 mg total) by mouth daily.  90 tablet  0   .  docusate sodium (COLACE) 100 MG capsule  Take 100 mg by mouth 2 (two) times daily.     Marland Kitchen  DOXYCYCLINE HYCLATE PO  Take by mouth daily.     .  folic acid (FOLVITE) 1 MG tablet  Take 1 tablet (1 mg total) by mouth daily.  90 tablet  1   .  levofloxacin (LEVAQUIN) 500 MG tablet  Take 500 mg by mouth daily.     .  methotrexate (RHEUMATREX) 2.5 MG tablet  Take 2.5 mg by  mouth once a week. Caution:Chemotherapy. Protect from light.     Marland Kitchen  omeprazole (PRILOSEC) 40 MG capsule  Take 40 mg by mouth daily.     .  Potassium Gluconate 595 MG CAPS  Take by mouth daily.     .  predniSONE (DELTASONE) 20 MG tablet  3 tabs po once daily x 5 days  15 tablet  0    No current facility-administered medications for this visit.   Allergies  Review of patient's allergies indicates no known allergies.  Family History:  Family History   Problem  Relation  Age of Onset   .  Cancer  Mother      cervical cancer in 68   .  Arthritis  Father    .  Stroke  Brother      AMI   .  Cancer  Brother      prostate   Social History:  History    Social History   .  Marital Status:  Married     Spouse Name:  N/A     Number of Children:  N/A   .  Years of Education:  N/A    Occupational History   .  Not on file.    Social History Main Topics   .  Smoking status:  Current Every Day Smoker -- 1.50  packs/day   .  Smokeless tobacco:  Not on file   .  Alcohol Use:  No   .  Drug Use:  No   .  Sexual Activity:     Other Topics  Concern   .  Not on file    Social History Narrative    He is originally from [place].   Electrocardiogram:SR rate 73 LAD LVH  Assessment and Plan  CAD:  CT with dense LM and LAD calcium  SSCP  myovue with inferior infarct and periinfarct ischemia.  EF 45%  Cath indicated for symptoms low EF and abnormal myovue particularly in setting  Of baseline severe oxygen dependant COPD  Will use femoral approach as CT shows abberant right subclavian

## 2013-07-13 ENCOUNTER — Telehealth: Payer: Self-pay | Admitting: Cardiovascular Disease

## 2013-07-13 NOTE — Telephone Encounter (Signed)
SPOKE WITH WIFE  LUMP  NOTED SIZE  OF PEANUT   NO  DRAINAGE  NOTED OR    NOT HOT  TO TOUCH   NOT  REALLY  PAINFUL  BUT  IS  SOMEWHAT  SENSITIVE   ENCOURAGED TO  CONT TO MONITOR   AND IF   NO IMPROVEMENT  AFTER  COUPLE OF  DAYS  TO CALL BACK./CY

## 2013-07-13 NOTE — Telephone Encounter (Signed)
New Prob    Has some questions regarding a lump to pts groin post cath. Please call.

## 2013-07-28 DIAGNOSIS — H01009 Unspecified blepharitis unspecified eye, unspecified eyelid: Secondary | ICD-10-CM | POA: Diagnosis not present

## 2013-07-28 DIAGNOSIS — H04129 Dry eye syndrome of unspecified lacrimal gland: Secondary | ICD-10-CM | POA: Diagnosis not present

## 2013-07-28 DIAGNOSIS — H2510 Age-related nuclear cataract, unspecified eye: Secondary | ICD-10-CM | POA: Diagnosis not present

## 2013-08-03 DIAGNOSIS — J441 Chronic obstructive pulmonary disease with (acute) exacerbation: Secondary | ICD-10-CM | POA: Diagnosis not present

## 2013-08-06 DIAGNOSIS — J441 Chronic obstructive pulmonary disease with (acute) exacerbation: Secondary | ICD-10-CM | POA: Diagnosis not present

## 2013-08-31 DIAGNOSIS — J449 Chronic obstructive pulmonary disease, unspecified: Secondary | ICD-10-CM | POA: Diagnosis not present

## 2013-08-31 DIAGNOSIS — F17200 Nicotine dependence, unspecified, uncomplicated: Secondary | ICD-10-CM | POA: Diagnosis not present

## 2013-08-31 DIAGNOSIS — J9610 Chronic respiratory failure, unspecified whether with hypoxia or hypercapnia: Secondary | ICD-10-CM | POA: Diagnosis not present

## 2013-08-31 DIAGNOSIS — R0609 Other forms of dyspnea: Secondary | ICD-10-CM | POA: Diagnosis not present

## 2013-08-31 DIAGNOSIS — R0989 Other specified symptoms and signs involving the circulatory and respiratory systems: Secondary | ICD-10-CM | POA: Diagnosis not present

## 2013-09-11 DIAGNOSIS — R05 Cough: Secondary | ICD-10-CM | POA: Diagnosis not present

## 2013-09-11 DIAGNOSIS — J449 Chronic obstructive pulmonary disease, unspecified: Secondary | ICD-10-CM | POA: Diagnosis not present

## 2013-09-11 DIAGNOSIS — F17200 Nicotine dependence, unspecified, uncomplicated: Secondary | ICD-10-CM | POA: Diagnosis not present

## 2013-09-11 DIAGNOSIS — R059 Cough, unspecified: Secondary | ICD-10-CM | POA: Diagnosis not present

## 2013-09-11 DIAGNOSIS — R0609 Other forms of dyspnea: Secondary | ICD-10-CM | POA: Diagnosis not present

## 2013-09-11 DIAGNOSIS — J441 Chronic obstructive pulmonary disease with (acute) exacerbation: Secondary | ICD-10-CM | POA: Diagnosis not present

## 2013-11-05 DIAGNOSIS — F17200 Nicotine dependence, unspecified, uncomplicated: Secondary | ICD-10-CM | POA: Diagnosis not present

## 2013-11-05 DIAGNOSIS — R0902 Hypoxemia: Secondary | ICD-10-CM | POA: Diagnosis not present

## 2013-11-05 DIAGNOSIS — J449 Chronic obstructive pulmonary disease, unspecified: Secondary | ICD-10-CM | POA: Diagnosis not present

## 2013-11-05 DIAGNOSIS — J9610 Chronic respiratory failure, unspecified whether with hypoxia or hypercapnia: Secondary | ICD-10-CM | POA: Diagnosis not present

## 2014-03-06 ENCOUNTER — Encounter: Payer: Self-pay | Admitting: Emergency Medicine

## 2014-03-06 ENCOUNTER — Emergency Department (INDEPENDENT_AMBULATORY_CARE_PROVIDER_SITE_OTHER)
Admission: EM | Admit: 2014-03-06 | Discharge: 2014-03-06 | Disposition: A | Discharge status: Home / Self Care | Payer: Medicare Other | Source: Home / Self Care | Attending: Emergency Medicine | Admitting: Emergency Medicine

## 2014-03-06 DIAGNOSIS — H109 Unspecified conjunctivitis: Secondary | ICD-10-CM | POA: Diagnosis not present

## 2014-03-06 MED ORDER — TOBRAMYCIN 0.3 % OP SOLN: 2.0000 [drp] | OPHTHALMIC | Status: AC

## 2014-03-06 NOTE — ED Notes (Signed)
Watery left eye. Swelling to lower lid. Denies pain.

## 2014-03-06 NOTE — ED Provider Notes (Signed)
CSN: [id number]     Arrival date & time 03/06/14  1731 History   First MD Initiated Contact with Patient 03/06/14 1758     Chief Complaint  Patient presents with  . Conjunctivitis   (Consider location/radiation/quality/duration/timing/severity/associated sxs/prior Treatment) Patient is a 68 y.o. male presenting with conjunctivitis. The history is provided by the patient. No language interpreter was used.  Conjunctivitis This is a new problem. The current episode started today. The problem occurs constantly. The problem has been gradually worsening. Associated symptoms comments: Eye redness. Nothing aggravates the symptoms. He has tried nothing for the symptoms. The treatment provided moderate relief.  Pt reports eye is red and watery.  No known injury,  No pain no foreign body  Past Medical History  Diagnosis Date  . TB (pulmonary tuberculosis)   . Arthritis   . COPD (chronic obstructive pulmonary disease)   . AAA   . DYSLIPIDEMIA   . BRONCHIECTASIS W/O ACUTE EXACERBATION   . CHEST PAIN, ATYPICAL   . DEGENERATIVE DISC DISEASE, CERVICAL SPINE   . INGUINAL HERNIA   . NECK PAIN, CHRONIC   . Nonspecific (abnormal) findings on radiological and other examination of body structure    Past Surgical History  Procedure Laterality Date  . Wrist surgery      L wrist surgery 1984 & 2010  . R mcp      2006  . Bronchoscopy      for scarred lung 2002 from TB   Family History  Problem Relation Age of Onset  . Cancer Mother     cervical cancer in 105  . Arthritis Father   . Stroke Brother     AMI  . Cancer Brother     prostate   History  Substance Use Topics  . Smoking status: Current Every Day Smoker -- 1.50 packs/day  . Smokeless tobacco: Not on file  . Alcohol Use: No    Review of Systems  All other systems reviewed and are negative.   Allergies  Review of patient's allergies indicates no known allergies.  Home Medications   Prior to Admission medications    Medication Sig Start Date End Date Taking? Authorizing Provider  albuterol (PROAIR HFA) 108 (90 BASE) MCG/ACT inhaler Inhale 2 puffs into the lungs every 4 (four) hours as needed for wheezing or shortness of breath. 04/17/11   Agapito Games, MD  atorvastatin (LIPITOR) 20 MG tablet Take 20 mg by mouth daily.    Historical Provider, MD  docusate sodium (COLACE) 100 MG capsule Take 100 mg by mouth daily.     Historical Provider, MD  Doxycycline Hyclate 150 MG TABS Take 1 tablet by mouth 3 (three) times a week.    Historical Provider, MD  Fluticasone-Salmeterol (ADVAIR DISKUS) 250-50 MCG/DOSE AEPB Inhale 1 puff into the lungs daily.    Historical Provider, MD  levofloxacin (LEVAQUIN) 500 MG tablet Take 500 mg by mouth daily.    Historical Provider, MD  potassium chloride (K-DUR) 10 MEQ tablet Take 1 tablet (10 mEq total) by mouth daily. 06/30/13   Wendall Stade, MD  Potassium Gluconate 595 MG CAPS Take 1 capsule by mouth daily as needed (for pain in legs).     Historical Provider, MD  predniSONE (DELTASONE) 20 MG tablet Take 20 mg by mouth daily with breakfast. 3 tabs po once daily x 5 days 12/13/10   Scot Jun Bowen, DO  tiotropium (SPIRIVA) 18 MCG inhalation capsule Place 18 mcg into inhaler and inhale 2 (two) times  daily.    Historical Provider, MD   BP 117/76  Pulse 89  Temp(Src) 98.1 F (36.7 C) (Oral)  Resp 16  Ht 6' (1.829 m)  Wt 178 lb (80.74 kg)  BMI 24.14 kg/m2  SpO2 96% Physical Exam  Constitutional: He is oriented to person, place, and time. He appears well-developed and well-nourished.  HENT:  Head: Normocephalic and atraumatic.  Eyes: EOM are normal. Pupils are equal, round, and reactive to light.  Injected conjunctiva,  Tearing,  Slight colored exudate corner of eye  Cardiovascular: Normal rate.   Pulmonary/Chest: Effort normal.  Neurological: He is alert and oriented to person, place, and time. He has normal reflexes.  Skin: Skin is warm.  Nursing note and vitals  reviewed.   ED Course  Procedures (including critical care time) Labs Review Labs Reviewed - No data to display  Imaging Review No results found.   MDM   1. Conjunctivitis of left eye    Tobrex opth solution Pt advised to follow up with Dr. Vonna Kotyk if symptoms persist past Monday. AVS    Elson Areas, PA-C 03/07/14 [phone]

## 2014-03-06 NOTE — Discharge Instructions (Signed)

## 2014-03-17 DIAGNOSIS — J441 Chronic obstructive pulmonary disease with (acute) exacerbation: Secondary | ICD-10-CM | POA: Diagnosis not present

## 2014-03-17 DIAGNOSIS — J449 Chronic obstructive pulmonary disease, unspecified: Secondary | ICD-10-CM | POA: Diagnosis not present

## 2014-03-17 DIAGNOSIS — J9611 Chronic respiratory failure with hypoxia: Secondary | ICD-10-CM | POA: Diagnosis not present

## 2014-03-17 DIAGNOSIS — Z23 Encounter for immunization: Secondary | ICD-10-CM | POA: Diagnosis not present

## 2014-03-17 DIAGNOSIS — R0602 Shortness of breath: Secondary | ICD-10-CM | POA: Diagnosis not present

## 2014-03-19 DIAGNOSIS — J449 Chronic obstructive pulmonary disease, unspecified: Secondary | ICD-10-CM | POA: Diagnosis not present

## 2014-03-19 DIAGNOSIS — J439 Emphysema, unspecified: Secondary | ICD-10-CM | POA: Diagnosis not present

## 2014-03-19 DIAGNOSIS — R0602 Shortness of breath: Secondary | ICD-10-CM | POA: Diagnosis not present

## 2014-04-12 DIAGNOSIS — J449 Chronic obstructive pulmonary disease, unspecified: Secondary | ICD-10-CM | POA: Diagnosis not present

## 2014-04-12 DIAGNOSIS — R64 Cachexia: Secondary | ICD-10-CM | POA: Diagnosis not present

## 2014-04-12 DIAGNOSIS — Z720 Tobacco use: Secondary | ICD-10-CM | POA: Diagnosis not present

## 2014-04-12 DIAGNOSIS — J9611 Chronic respiratory failure with hypoxia: Secondary | ICD-10-CM | POA: Diagnosis not present

## 2014-04-12 DIAGNOSIS — J984 Other disorders of lung: Secondary | ICD-10-CM | POA: Diagnosis not present

## 2014-04-12 DIAGNOSIS — J441 Chronic obstructive pulmonary disease with (acute) exacerbation: Secondary | ICD-10-CM | POA: Diagnosis not present

## 2014-04-15 ENCOUNTER — Encounter (HOSPITAL_COMMUNITY): Payer: Self-pay | Admitting: Cardiovascular Disease

## 2014-06-15 DIAGNOSIS — J449 Chronic obstructive pulmonary disease, unspecified: Secondary | ICD-10-CM | POA: Diagnosis not present

## 2014-06-15 DIAGNOSIS — R64 Cachexia: Secondary | ICD-10-CM | POA: Diagnosis not present

## 2014-06-15 DIAGNOSIS — J9611 Chronic respiratory failure with hypoxia: Secondary | ICD-10-CM | POA: Diagnosis not present

## 2014-06-15 DIAGNOSIS — Z720 Tobacco use: Secondary | ICD-10-CM | POA: Diagnosis not present

## 2014-06-18 DIAGNOSIS — J9611 Chronic respiratory failure with hypoxia: Secondary | ICD-10-CM | POA: Diagnosis not present

## 2014-06-18 DIAGNOSIS — J439 Emphysema, unspecified: Secondary | ICD-10-CM | POA: Diagnosis not present

## 2014-06-18 DIAGNOSIS — J449 Chronic obstructive pulmonary disease, unspecified: Secondary | ICD-10-CM | POA: Diagnosis not present

## 2014-06-18 DIAGNOSIS — Z720 Tobacco use: Secondary | ICD-10-CM | POA: Diagnosis not present

## 2014-06-18 DIAGNOSIS — R64 Cachexia: Secondary | ICD-10-CM | POA: Diagnosis not present

## 2014-07-14 ENCOUNTER — Other Ambulatory Visit: Payer: Self-pay | Admitting: *Deleted

## 2014-07-14 DIAGNOSIS — E876 Hypokalemia: Secondary | ICD-10-CM

## 2014-07-14 MED ORDER — POTASSIUM CHLORIDE ER 10 MEQ PO TBCR: 10.0000 meq | EXTENDED_RELEASE_TABLET | Freq: Every day | ORAL | Status: AC

## 2014-10-05 DIAGNOSIS — R64 Cachexia: Secondary | ICD-10-CM | POA: Diagnosis not present

## 2014-10-05 DIAGNOSIS — J9611 Chronic respiratory failure with hypoxia: Secondary | ICD-10-CM | POA: Diagnosis not present

## 2014-10-05 DIAGNOSIS — J449 Chronic obstructive pulmonary disease, unspecified: Secondary | ICD-10-CM | POA: Diagnosis not present

## 2014-10-05 DIAGNOSIS — J441 Chronic obstructive pulmonary disease with (acute) exacerbation: Secondary | ICD-10-CM | POA: Diagnosis not present

## 2014-11-17 DIAGNOSIS — R05 Cough: Secondary | ICD-10-CM | POA: Diagnosis not present

## 2014-11-17 DIAGNOSIS — J449 Chronic obstructive pulmonary disease, unspecified: Secondary | ICD-10-CM | POA: Diagnosis not present

## 2014-11-17 DIAGNOSIS — J9611 Chronic respiratory failure with hypoxia: Secondary | ICD-10-CM | POA: Diagnosis not present

## 2014-11-17 DIAGNOSIS — J441 Chronic obstructive pulmonary disease with (acute) exacerbation: Secondary | ICD-10-CM | POA: Diagnosis not present

## 2014-12-27 DIAGNOSIS — Z23 Encounter for immunization: Secondary | ICD-10-CM | POA: Diagnosis not present

## 2014-12-27 DIAGNOSIS — Z125 Encounter for screening for malignant neoplasm of prostate: Secondary | ICD-10-CM | POA: Diagnosis not present

## 2014-12-27 DIAGNOSIS — E785 Hyperlipidemia, unspecified: Secondary | ICD-10-CM | POA: Diagnosis not present

## 2014-12-27 DIAGNOSIS — R1010 Upper abdominal pain, unspecified: Secondary | ICD-10-CM | POA: Diagnosis not present

## 2014-12-27 DIAGNOSIS — K5909 Other constipation: Secondary | ICD-10-CM | POA: Diagnosis not present

## 2014-12-27 DIAGNOSIS — J449 Chronic obstructive pulmonary disease, unspecified: Secondary | ICD-10-CM | POA: Diagnosis not present

## 2014-12-29 DIAGNOSIS — J439 Emphysema, unspecified: Secondary | ICD-10-CM | POA: Diagnosis not present

## 2014-12-29 DIAGNOSIS — F17210 Nicotine dependence, cigarettes, uncomplicated: Secondary | ICD-10-CM | POA: Diagnosis not present

## 2014-12-29 DIAGNOSIS — J9611 Chronic respiratory failure with hypoxia: Secondary | ICD-10-CM | POA: Diagnosis not present

## 2014-12-29 DIAGNOSIS — R918 Other nonspecific abnormal finding of lung field: Secondary | ICD-10-CM | POA: Diagnosis not present

## 2014-12-29 DIAGNOSIS — J441 Chronic obstructive pulmonary disease with (acute) exacerbation: Secondary | ICD-10-CM | POA: Diagnosis not present

## 2014-12-29 DIAGNOSIS — R05 Cough: Secondary | ICD-10-CM | POA: Diagnosis not present

## 2015-01-11 DIAGNOSIS — J441 Chronic obstructive pulmonary disease with (acute) exacerbation: Secondary | ICD-10-CM | POA: Diagnosis not present

## 2015-01-11 DIAGNOSIS — Z720 Tobacco use: Secondary | ICD-10-CM | POA: Diagnosis not present

## 2015-01-11 DIAGNOSIS — J449 Chronic obstructive pulmonary disease, unspecified: Secondary | ICD-10-CM | POA: Diagnosis not present

## 2015-01-11 DIAGNOSIS — F17210 Nicotine dependence, cigarettes, uncomplicated: Secondary | ICD-10-CM | POA: Diagnosis not present

## 2015-01-11 DIAGNOSIS — J9601 Acute respiratory failure with hypoxia: Secondary | ICD-10-CM | POA: Diagnosis not present

## 2015-01-11 DIAGNOSIS — R0600 Dyspnea, unspecified: Secondary | ICD-10-CM | POA: Diagnosis not present

## 2015-01-11 DIAGNOSIS — J9611 Chronic respiratory failure with hypoxia: Secondary | ICD-10-CM | POA: Diagnosis not present

## 2015-01-19 DIAGNOSIS — Z79899 Other long term (current) drug therapy: Secondary | ICD-10-CM | POA: Diagnosis not present

## 2015-01-19 DIAGNOSIS — J9611 Chronic respiratory failure with hypoxia: Secondary | ICD-10-CM | POA: Diagnosis not present

## 2015-01-19 DIAGNOSIS — R0602 Shortness of breath: Secondary | ICD-10-CM | POA: Diagnosis not present

## 2015-01-19 DIAGNOSIS — K760 Fatty (change of) liver, not elsewhere classified: Secondary | ICD-10-CM | POA: Diagnosis not present

## 2015-01-19 DIAGNOSIS — R1011 Right upper quadrant pain: Secondary | ICD-10-CM | POA: Diagnosis not present

## 2015-01-19 DIAGNOSIS — J449 Chronic obstructive pulmonary disease, unspecified: Secondary | ICD-10-CM | POA: Diagnosis not present

## 2015-01-19 DIAGNOSIS — F17200 Nicotine dependence, unspecified, uncomplicated: Secondary | ICD-10-CM | POA: Diagnosis not present

## 2015-01-19 DIAGNOSIS — J45909 Unspecified asthma, uncomplicated: Secondary | ICD-10-CM | POA: Diagnosis not present

## 2015-01-19 DIAGNOSIS — F17210 Nicotine dependence, cigarettes, uncomplicated: Secondary | ICD-10-CM | POA: Diagnosis not present

## 2015-01-19 DIAGNOSIS — J441 Chronic obstructive pulmonary disease with (acute) exacerbation: Secondary | ICD-10-CM | POA: Diagnosis not present

## 2015-03-06 IMAGING — CT CT ABD-PELV W/ CM
2 of 5 series · 17 of 46 positions shown, 19 images · IV contrast (APPLIED)
Comparison: 09/14/2009

CLINICAL DATA: Chronic abdominal and epigastric pain.

EXAM:
CT ABDOMEN AND PELVIS WITH CONTRAST
TECHNIQUE: Multidetector CT imaging of the abdomen and pelvis was performed
using the standard protocol following bolus administration of
intravenous contrast.
CONTRAST:  100mL OMNIPAQUE IOHEXOL 300 MG/ML  SOLN

[Series 2: abd/pelvis 5.0 b31f · axial · 0.71mm/px · z∈[+474,+824]mm · 14 of 80 slices shown, 16 images]
[im 5/80  soft-tissue]
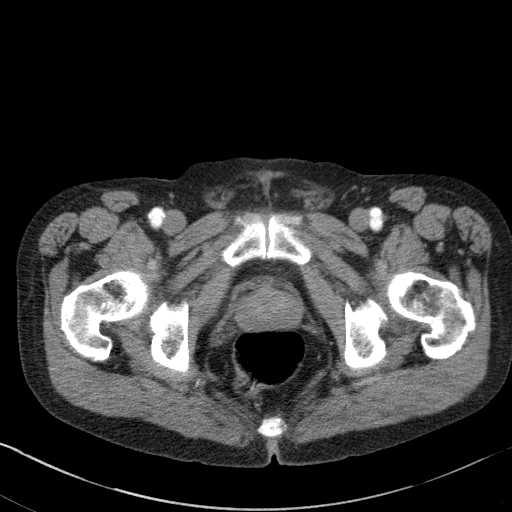
[im 5/80  bone]
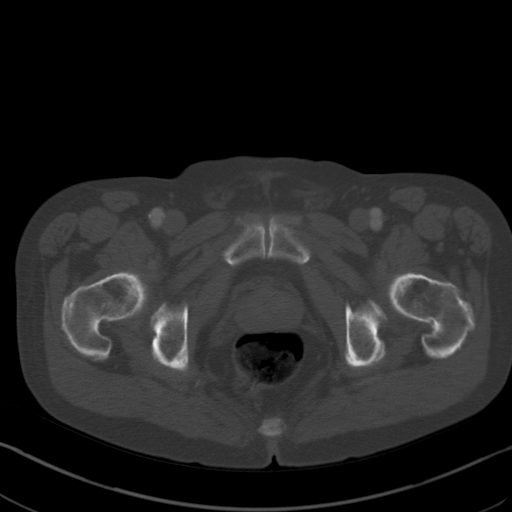
[im 9/80  soft-tissue]
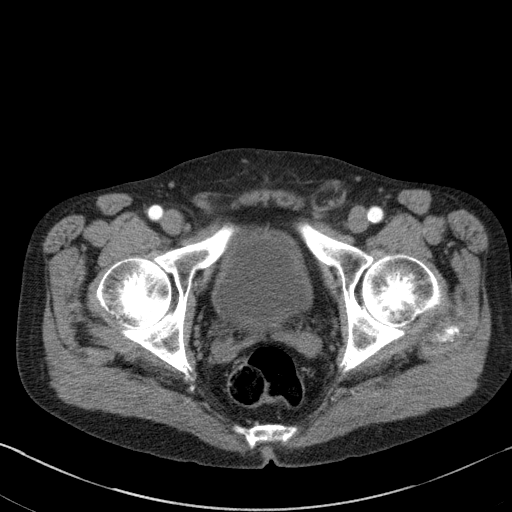
[im 17/80  soft-tissue]
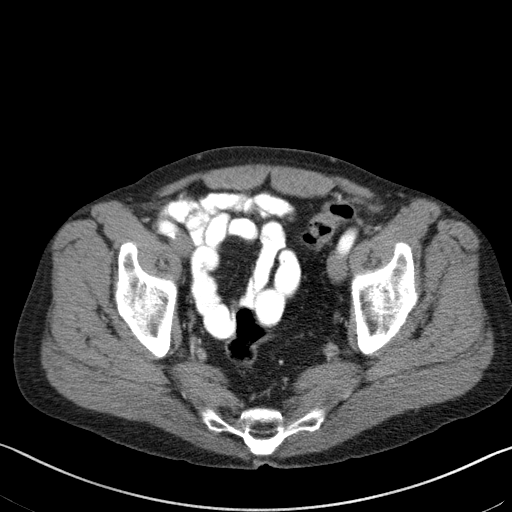
[im 21/80  soft-tissue]
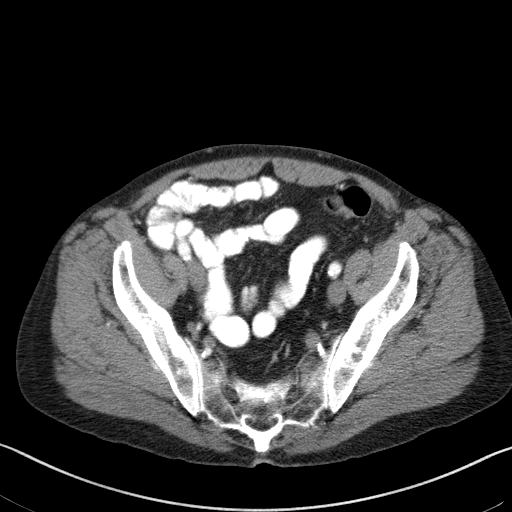
[im 25/80  soft-tissue]
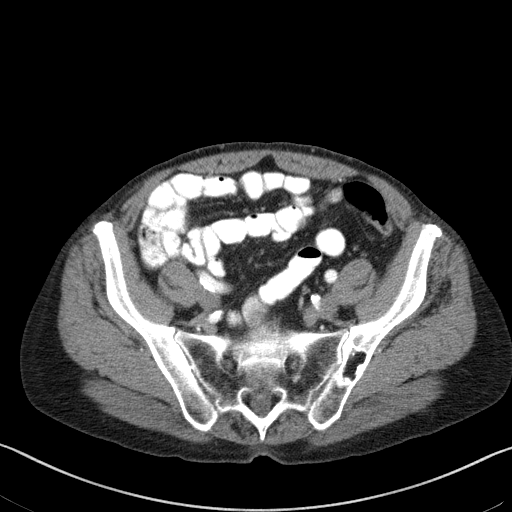
[im 34/80  soft-tissue]
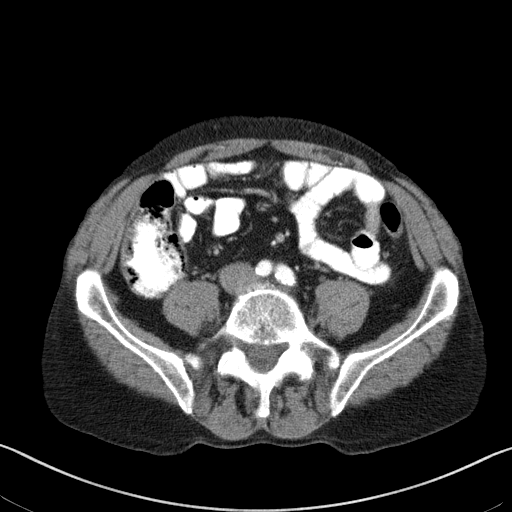
[im 38/80  soft-tissue]
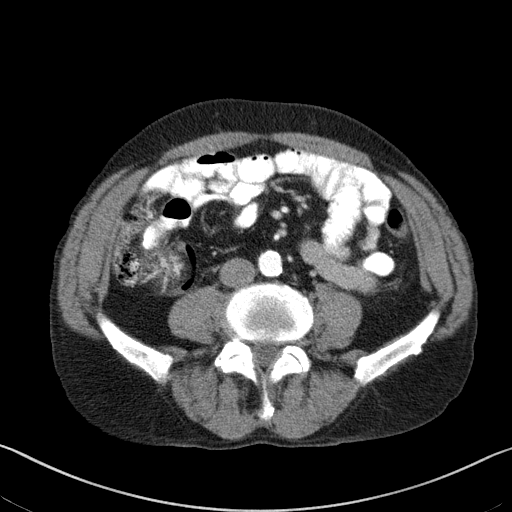
[im 42/80  soft-tissue]
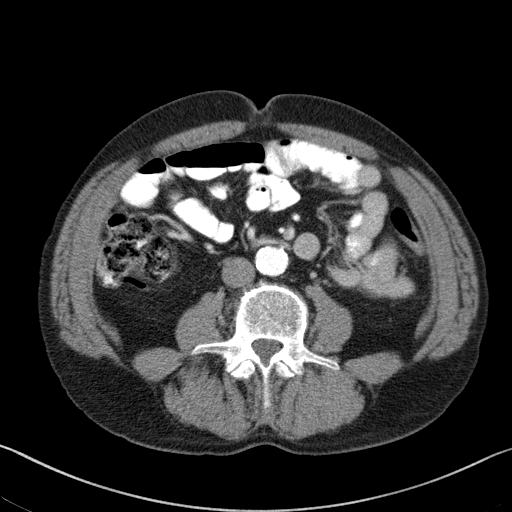
[im 46/80  soft-tissue]
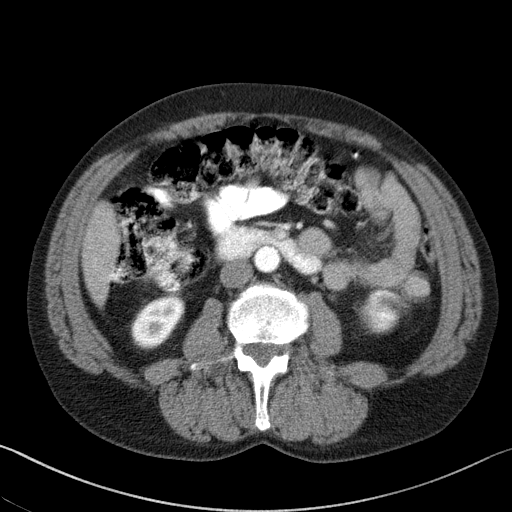
[im 46/80  bone]
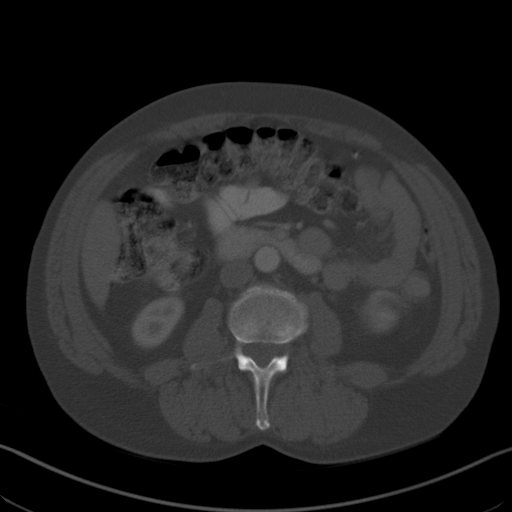
[im 55/80  soft-tissue]
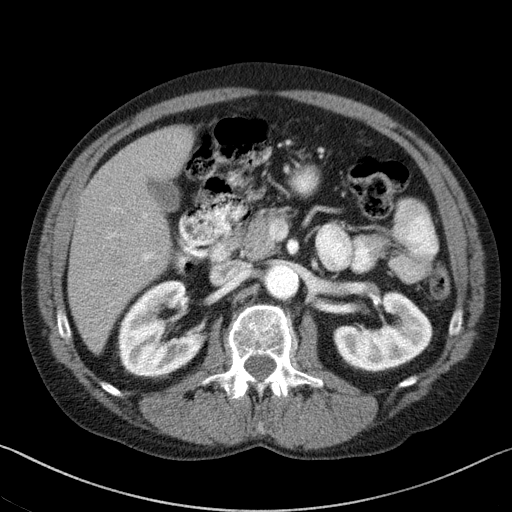
[im 59/80  soft-tissue]
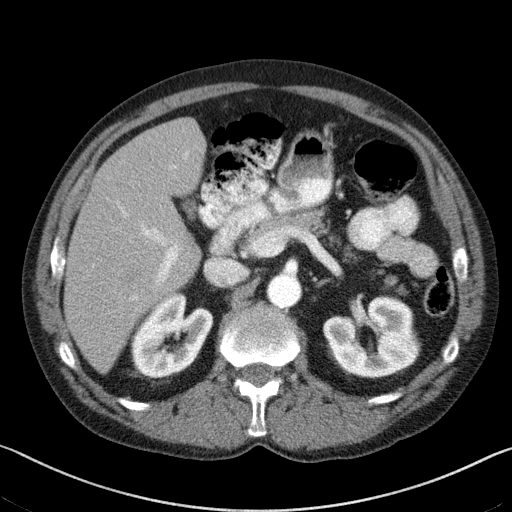
[im 63/80  soft-tissue]
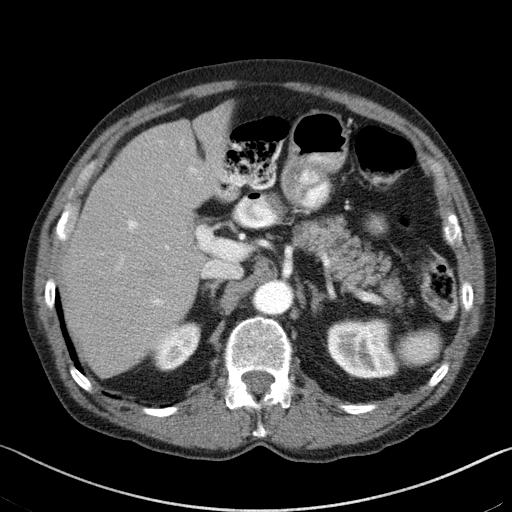
[im 71/80  soft-tissue]
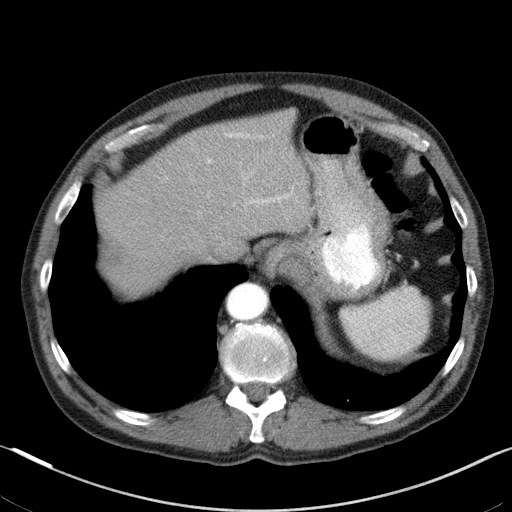
[im 75/80  soft-tissue]
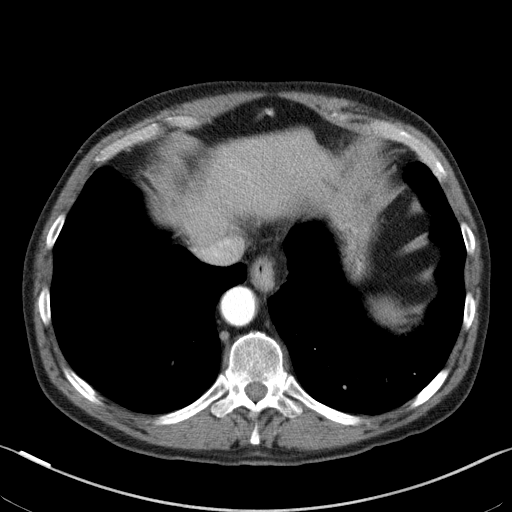

[Series 5: abd/pelvis 3.0 coronal · coronal · 0.69mm/px · 3 of 94 slices shown]
[im 32/94  soft-tissue]
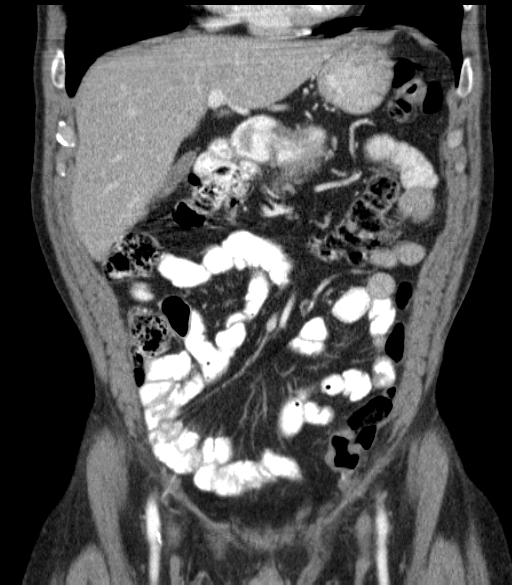
[im 42/94  soft-tissue]
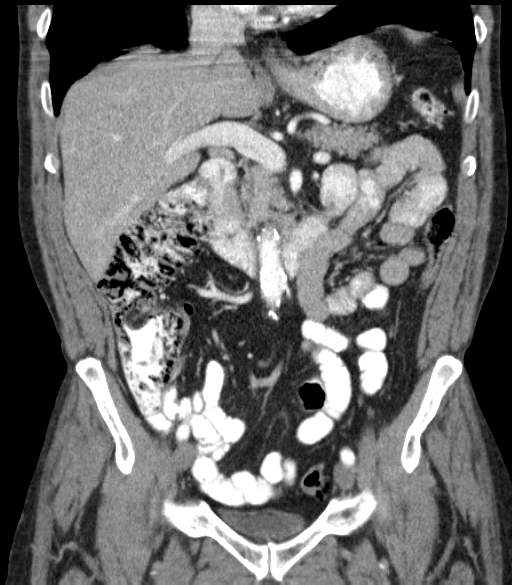
[im 52/94  soft-tissue]
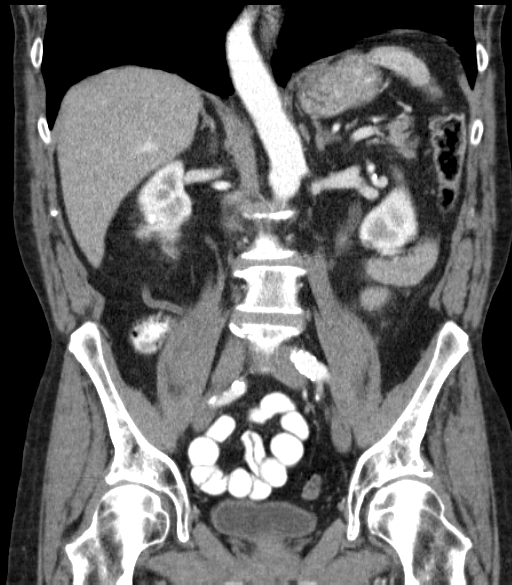

[17 of 46 positions shown; findings below may reference images not displayed]

FINDINGS: The liver, gallbladder, pancreas, spleen, and adrenal glands are
normal in appearance. Small left upper pole renal cyst is again
noted. No complex cystic or solid renal masses are identified, and
there is no evidence of hydronephrosis.

No soft tissue masses or lymphadenopathy identified within the
abdomen or pelvis. No evidence of inflammatory process or abnormal
fluid collections. Normal appendix is visualized. Diverticulosis is
seen involving the sigmoid colon, however there is no evidence of
diverticulitis. A tiny left inguinal hernia is again seen containing
only fat. No evidence of herniated bowel. Images through the lung
bases show chronic right lower lobe emphysema and bronchiectasis.
IMPRESSION: Stable exam.  No acute findings.

Sigmoid diverticulosis. No radiographic evidence of diverticulitis.

Tiny left inguinal hernia containing only fat.

## 2015-03-07 DIAGNOSIS — J449 Chronic obstructive pulmonary disease, unspecified: Secondary | ICD-10-CM | POA: Diagnosis not present

## 2015-03-07 DIAGNOSIS — J9611 Chronic respiratory failure with hypoxia: Secondary | ICD-10-CM | POA: Diagnosis not present

## 2015-03-07 DIAGNOSIS — F17210 Nicotine dependence, cigarettes, uncomplicated: Secondary | ICD-10-CM | POA: Diagnosis not present

## 2015-03-07 DIAGNOSIS — J441 Chronic obstructive pulmonary disease with (acute) exacerbation: Secondary | ICD-10-CM | POA: Diagnosis not present

## 2015-03-15 DIAGNOSIS — J449 Chronic obstructive pulmonary disease, unspecified: Secondary | ICD-10-CM | POA: Diagnosis not present

## 2015-03-15 DIAGNOSIS — J441 Chronic obstructive pulmonary disease with (acute) exacerbation: Secondary | ICD-10-CM | POA: Diagnosis not present

## 2015-03-15 DIAGNOSIS — R079 Chest pain, unspecified: Secondary | ICD-10-CM | POA: Diagnosis not present

## 2015-03-15 DIAGNOSIS — J939 Pneumothorax, unspecified: Secondary | ICD-10-CM | POA: Diagnosis not present

## 2015-03-15 DIAGNOSIS — Z79899 Other long term (current) drug therapy: Secondary | ICD-10-CM | POA: Diagnosis not present

## 2015-03-15 DIAGNOSIS — E873 Alkalosis: Secondary | ICD-10-CM | POA: Diagnosis not present

## 2015-03-15 DIAGNOSIS — F17210 Nicotine dependence, cigarettes, uncomplicated: Secondary | ICD-10-CM | POA: Diagnosis not present

## 2015-03-15 DIAGNOSIS — Z7952 Long term (current) use of systemic steroids: Secondary | ICD-10-CM | POA: Diagnosis not present

## 2015-03-15 DIAGNOSIS — F17200 Nicotine dependence, unspecified, uncomplicated: Secondary | ICD-10-CM | POA: Diagnosis not present

## 2015-03-15 DIAGNOSIS — I771 Stricture of artery: Secondary | ICD-10-CM | POA: Diagnosis not present

## 2015-03-15 DIAGNOSIS — J9611 Chronic respiratory failure with hypoxia: Secondary | ICD-10-CM | POA: Diagnosis not present

## 2015-03-16 DIAGNOSIS — J939 Pneumothorax, unspecified: Secondary | ICD-10-CM | POA: Diagnosis not present

## 2015-03-16 DIAGNOSIS — J9811 Atelectasis: Secondary | ICD-10-CM | POA: Diagnosis not present

## 2015-03-16 DIAGNOSIS — E873 Alkalosis: Secondary | ICD-10-CM | POA: Diagnosis not present

## 2015-03-16 DIAGNOSIS — Z79899 Other long term (current) drug therapy: Secondary | ICD-10-CM | POA: Diagnosis not present

## 2015-03-16 DIAGNOSIS — J984 Other disorders of lung: Secondary | ICD-10-CM | POA: Diagnosis not present

## 2015-03-16 DIAGNOSIS — F17200 Nicotine dependence, unspecified, uncomplicated: Secondary | ICD-10-CM | POA: Diagnosis not present

## 2015-03-16 DIAGNOSIS — J441 Chronic obstructive pulmonary disease with (acute) exacerbation: Secondary | ICD-10-CM | POA: Diagnosis not present

## 2015-03-16 DIAGNOSIS — R0602 Shortness of breath: Secondary | ICD-10-CM | POA: Diagnosis not present

## 2015-03-16 DIAGNOSIS — Z7952 Long term (current) use of systemic steroids: Secondary | ICD-10-CM | POA: Diagnosis not present

## 2015-03-16 DIAGNOSIS — J439 Emphysema, unspecified: Secondary | ICD-10-CM | POA: Diagnosis not present

## 2015-03-16 DIAGNOSIS — R918 Other nonspecific abnormal finding of lung field: Secondary | ICD-10-CM | POA: Diagnosis not present

## 2015-03-16 DIAGNOSIS — D72829 Elevated white blood cell count, unspecified: Secondary | ICD-10-CM | POA: Diagnosis not present

## 2015-03-19 DIAGNOSIS — Z87891 Personal history of nicotine dependence: Secondary | ICD-10-CM | POA: Diagnosis not present

## 2015-03-19 DIAGNOSIS — J479 Bronchiectasis, uncomplicated: Secondary | ICD-10-CM | POA: Diagnosis not present

## 2015-03-19 DIAGNOSIS — J9612 Chronic respiratory failure with hypercapnia: Secondary | ICD-10-CM | POA: Diagnosis not present

## 2015-03-19 DIAGNOSIS — Z7952 Long term (current) use of systemic steroids: Secondary | ICD-10-CM | POA: Diagnosis not present

## 2015-03-19 DIAGNOSIS — Z9981 Dependence on supplemental oxygen: Secondary | ICD-10-CM | POA: Diagnosis not present

## 2015-03-19 DIAGNOSIS — E7800 Pure hypercholesterolemia, unspecified: Secondary | ICD-10-CM | POA: Diagnosis not present

## 2015-03-19 DIAGNOSIS — J9611 Chronic respiratory failure with hypoxia: Secondary | ICD-10-CM | POA: Diagnosis not present

## 2015-03-19 DIAGNOSIS — J441 Chronic obstructive pulmonary disease with (acute) exacerbation: Secondary | ICD-10-CM | POA: Diagnosis not present

## 2015-03-21 DIAGNOSIS — E7800 Pure hypercholesterolemia, unspecified: Secondary | ICD-10-CM | POA: Diagnosis not present

## 2015-03-21 DIAGNOSIS — J9612 Chronic respiratory failure with hypercapnia: Secondary | ICD-10-CM | POA: Diagnosis not present

## 2015-03-21 DIAGNOSIS — Z87891 Personal history of nicotine dependence: Secondary | ICD-10-CM | POA: Diagnosis not present

## 2015-03-21 DIAGNOSIS — J9611 Chronic respiratory failure with hypoxia: Secondary | ICD-10-CM | POA: Diagnosis not present

## 2015-03-21 DIAGNOSIS — J441 Chronic obstructive pulmonary disease with (acute) exacerbation: Secondary | ICD-10-CM | POA: Diagnosis not present

## 2015-03-21 DIAGNOSIS — J479 Bronchiectasis, uncomplicated: Secondary | ICD-10-CM | POA: Diagnosis not present

## 2015-03-23 DIAGNOSIS — K5790 Diverticulosis of intestine, part unspecified, without perforation or abscess without bleeding: Secondary | ICD-10-CM | POA: Diagnosis not present

## 2015-03-23 DIAGNOSIS — J939 Pneumothorax, unspecified: Secondary | ICD-10-CM | POA: Diagnosis not present

## 2015-03-23 DIAGNOSIS — F17200 Nicotine dependence, unspecified, uncomplicated: Secondary | ICD-10-CM | POA: Diagnosis not present

## 2015-03-23 DIAGNOSIS — J449 Chronic obstructive pulmonary disease, unspecified: Secondary | ICD-10-CM | POA: Diagnosis not present

## 2015-03-23 DIAGNOSIS — R091 Pleurisy: Secondary | ICD-10-CM | POA: Diagnosis not present

## 2015-03-23 DIAGNOSIS — E7800 Pure hypercholesterolemia, unspecified: Secondary | ICD-10-CM | POA: Diagnosis not present

## 2015-03-23 DIAGNOSIS — I452 Bifascicular block: Secondary | ICD-10-CM | POA: Diagnosis not present

## 2015-03-23 DIAGNOSIS — R64 Cachexia: Secondary | ICD-10-CM | POA: Diagnosis not present

## 2015-03-23 DIAGNOSIS — Z7952 Long term (current) use of systemic steroids: Secondary | ICD-10-CM | POA: Diagnosis not present

## 2015-03-23 DIAGNOSIS — J9611 Chronic respiratory failure with hypoxia: Secondary | ICD-10-CM | POA: Diagnosis not present

## 2015-03-23 DIAGNOSIS — R918 Other nonspecific abnormal finding of lung field: Secondary | ICD-10-CM | POA: Diagnosis not present

## 2015-03-23 DIAGNOSIS — K5730 Diverticulosis of large intestine without perforation or abscess without bleeding: Secondary | ICD-10-CM | POA: Diagnosis not present

## 2015-03-23 DIAGNOSIS — K5900 Constipation, unspecified: Secondary | ICD-10-CM | POA: Diagnosis not present

## 2015-03-23 DIAGNOSIS — Z79899 Other long term (current) drug therapy: Secondary | ICD-10-CM | POA: Diagnosis not present

## 2015-03-25 DIAGNOSIS — J9611 Chronic respiratory failure with hypoxia: Secondary | ICD-10-CM | POA: Diagnosis not present

## 2015-03-25 DIAGNOSIS — J479 Bronchiectasis, uncomplicated: Secondary | ICD-10-CM | POA: Diagnosis not present

## 2015-03-25 DIAGNOSIS — E7800 Pure hypercholesterolemia, unspecified: Secondary | ICD-10-CM | POA: Diagnosis not present

## 2015-03-25 DIAGNOSIS — Z87891 Personal history of nicotine dependence: Secondary | ICD-10-CM | POA: Diagnosis not present

## 2015-03-25 DIAGNOSIS — J441 Chronic obstructive pulmonary disease with (acute) exacerbation: Secondary | ICD-10-CM | POA: Diagnosis not present

## 2015-03-25 DIAGNOSIS — J9612 Chronic respiratory failure with hypercapnia: Secondary | ICD-10-CM | POA: Diagnosis not present

## 2015-04-03 DIAGNOSIS — J9612 Chronic respiratory failure with hypercapnia: Secondary | ICD-10-CM | POA: Diagnosis not present

## 2015-04-03 DIAGNOSIS — E7800 Pure hypercholesterolemia, unspecified: Secondary | ICD-10-CM | POA: Diagnosis not present

## 2015-04-03 DIAGNOSIS — J441 Chronic obstructive pulmonary disease with (acute) exacerbation: Secondary | ICD-10-CM | POA: Diagnosis not present

## 2015-04-03 DIAGNOSIS — J479 Bronchiectasis, uncomplicated: Secondary | ICD-10-CM | POA: Diagnosis not present

## 2015-04-03 DIAGNOSIS — Z87891 Personal history of nicotine dependence: Secondary | ICD-10-CM | POA: Diagnosis not present

## 2015-04-03 DIAGNOSIS — J9611 Chronic respiratory failure with hypoxia: Secondary | ICD-10-CM | POA: Diagnosis not present

## 2015-04-05 DIAGNOSIS — J9612 Chronic respiratory failure with hypercapnia: Secondary | ICD-10-CM | POA: Diagnosis not present

## 2015-04-05 DIAGNOSIS — J479 Bronchiectasis, uncomplicated: Secondary | ICD-10-CM | POA: Diagnosis not present

## 2015-04-05 DIAGNOSIS — J9611 Chronic respiratory failure with hypoxia: Secondary | ICD-10-CM | POA: Diagnosis not present

## 2015-04-05 DIAGNOSIS — Z87891 Personal history of nicotine dependence: Secondary | ICD-10-CM | POA: Diagnosis not present

## 2015-04-05 DIAGNOSIS — J441 Chronic obstructive pulmonary disease with (acute) exacerbation: Secondary | ICD-10-CM | POA: Diagnosis not present

## 2015-04-05 DIAGNOSIS — E7800 Pure hypercholesterolemia, unspecified: Secondary | ICD-10-CM | POA: Diagnosis not present

## 2015-04-07 DIAGNOSIS — Z87891 Personal history of nicotine dependence: Secondary | ICD-10-CM | POA: Diagnosis not present

## 2015-04-07 DIAGNOSIS — J441 Chronic obstructive pulmonary disease with (acute) exacerbation: Secondary | ICD-10-CM | POA: Diagnosis not present

## 2015-04-07 DIAGNOSIS — E7800 Pure hypercholesterolemia, unspecified: Secondary | ICD-10-CM | POA: Diagnosis not present

## 2015-04-07 DIAGNOSIS — J479 Bronchiectasis, uncomplicated: Secondary | ICD-10-CM | POA: Diagnosis not present

## 2015-04-07 DIAGNOSIS — J9611 Chronic respiratory failure with hypoxia: Secondary | ICD-10-CM | POA: Diagnosis not present

## 2015-04-07 DIAGNOSIS — J9612 Chronic respiratory failure with hypercapnia: Secondary | ICD-10-CM | POA: Diagnosis not present

## 2015-04-08 DIAGNOSIS — Z23 Encounter for immunization: Secondary | ICD-10-CM | POA: Diagnosis not present

## 2015-04-08 DIAGNOSIS — J449 Chronic obstructive pulmonary disease, unspecified: Secondary | ICD-10-CM | POA: Diagnosis not present

## 2015-04-08 DIAGNOSIS — R64 Cachexia: Secondary | ICD-10-CM | POA: Diagnosis not present

## 2015-04-12 DIAGNOSIS — J9611 Chronic respiratory failure with hypoxia: Secondary | ICD-10-CM | POA: Diagnosis not present

## 2015-04-12 DIAGNOSIS — J479 Bronchiectasis, uncomplicated: Secondary | ICD-10-CM | POA: Diagnosis not present

## 2015-04-12 DIAGNOSIS — J9612 Chronic respiratory failure with hypercapnia: Secondary | ICD-10-CM | POA: Diagnosis not present

## 2015-04-12 DIAGNOSIS — E7800 Pure hypercholesterolemia, unspecified: Secondary | ICD-10-CM | POA: Diagnosis not present

## 2015-04-12 DIAGNOSIS — J441 Chronic obstructive pulmonary disease with (acute) exacerbation: Secondary | ICD-10-CM | POA: Diagnosis not present

## 2015-04-12 DIAGNOSIS — Z87891 Personal history of nicotine dependence: Secondary | ICD-10-CM | POA: Diagnosis not present

## 2015-04-14 DIAGNOSIS — Z87891 Personal history of nicotine dependence: Secondary | ICD-10-CM | POA: Diagnosis not present

## 2015-04-14 DIAGNOSIS — J479 Bronchiectasis, uncomplicated: Secondary | ICD-10-CM | POA: Diagnosis not present

## 2015-04-14 DIAGNOSIS — J441 Chronic obstructive pulmonary disease with (acute) exacerbation: Secondary | ICD-10-CM | POA: Diagnosis not present

## 2015-04-14 DIAGNOSIS — E7800 Pure hypercholesterolemia, unspecified: Secondary | ICD-10-CM | POA: Diagnosis not present

## 2015-04-14 DIAGNOSIS — J9611 Chronic respiratory failure with hypoxia: Secondary | ICD-10-CM | POA: Diagnosis not present

## 2015-04-14 DIAGNOSIS — J9612 Chronic respiratory failure with hypercapnia: Secondary | ICD-10-CM | POA: Diagnosis not present

## 2015-04-15 IMAGING — CR DG CHEST 2V
2 series · 2 of 2 positions shown · non-contrast
Comparison: CT CHEST W/O CM dated 05/20/2013; DG CHEST 1V dated
05/12/2012

CLINICAL DATA: Chest pain.

EXAM:
CHEST  2 VIEW

[view not recorded (1 of 2)]
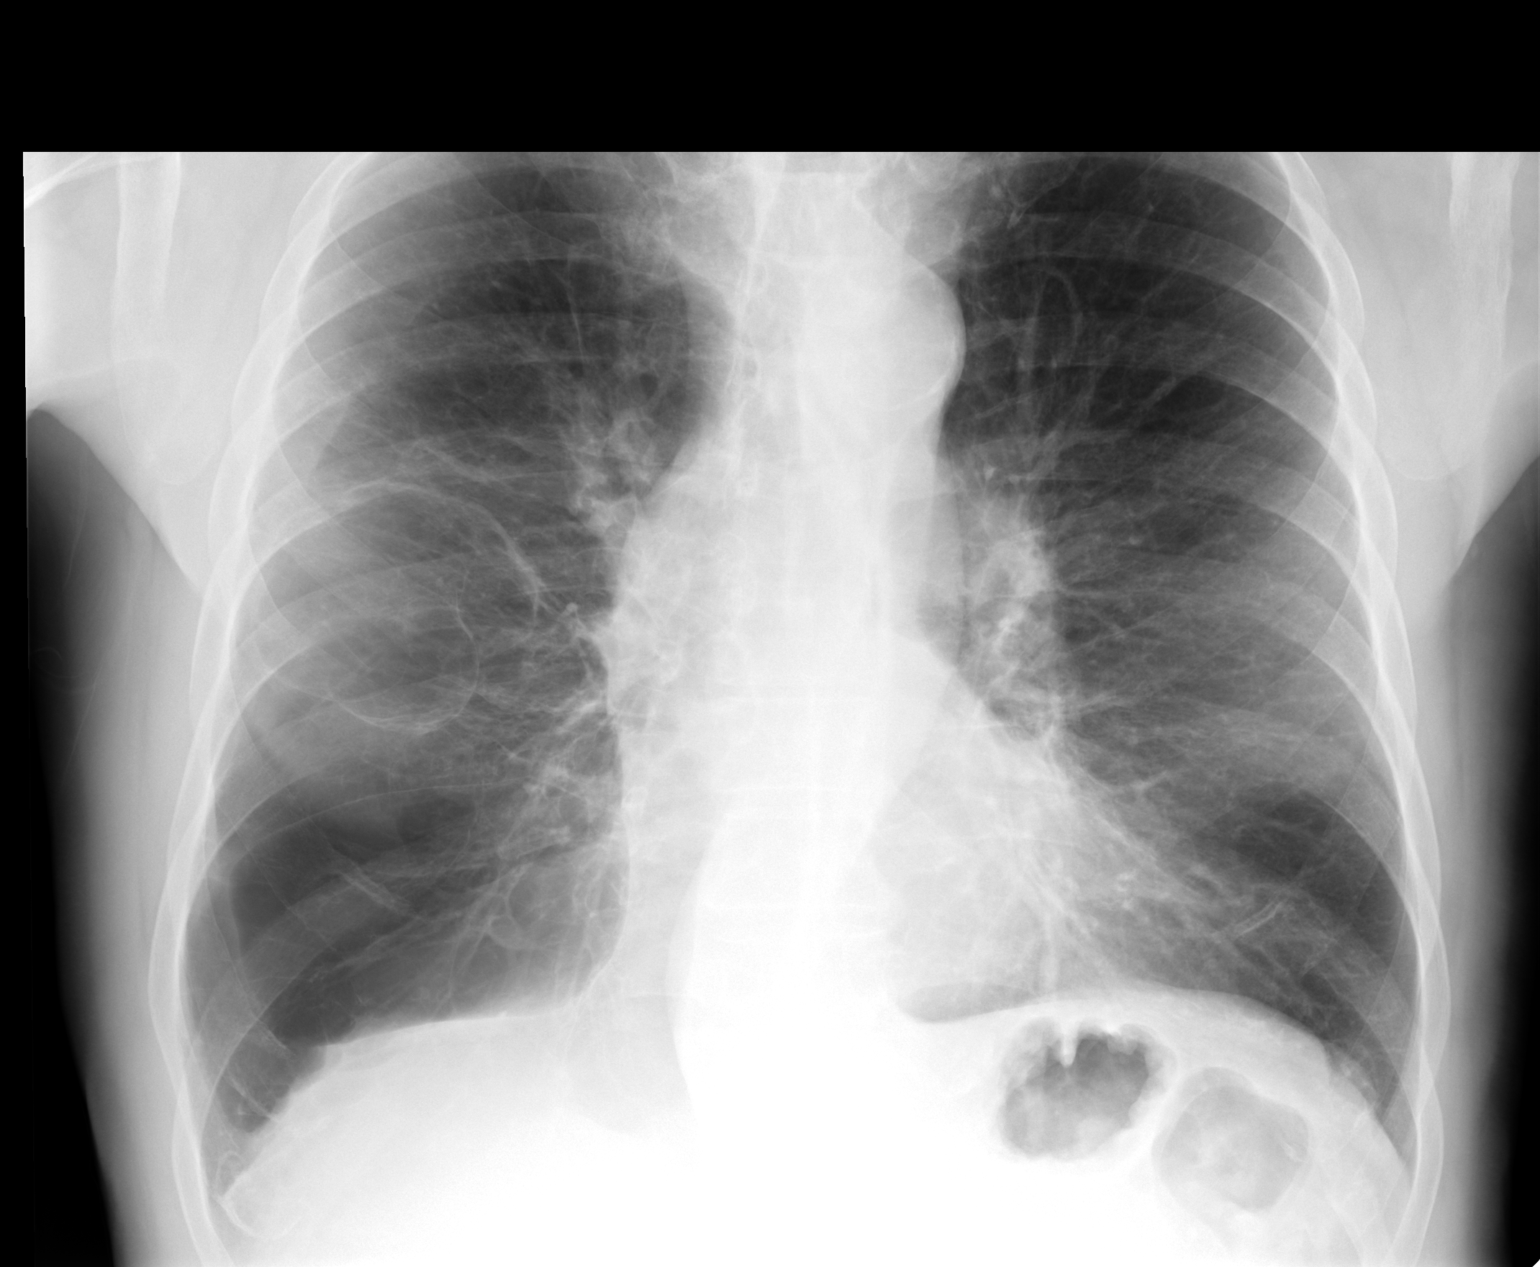

[view not recorded (2 of 2)]
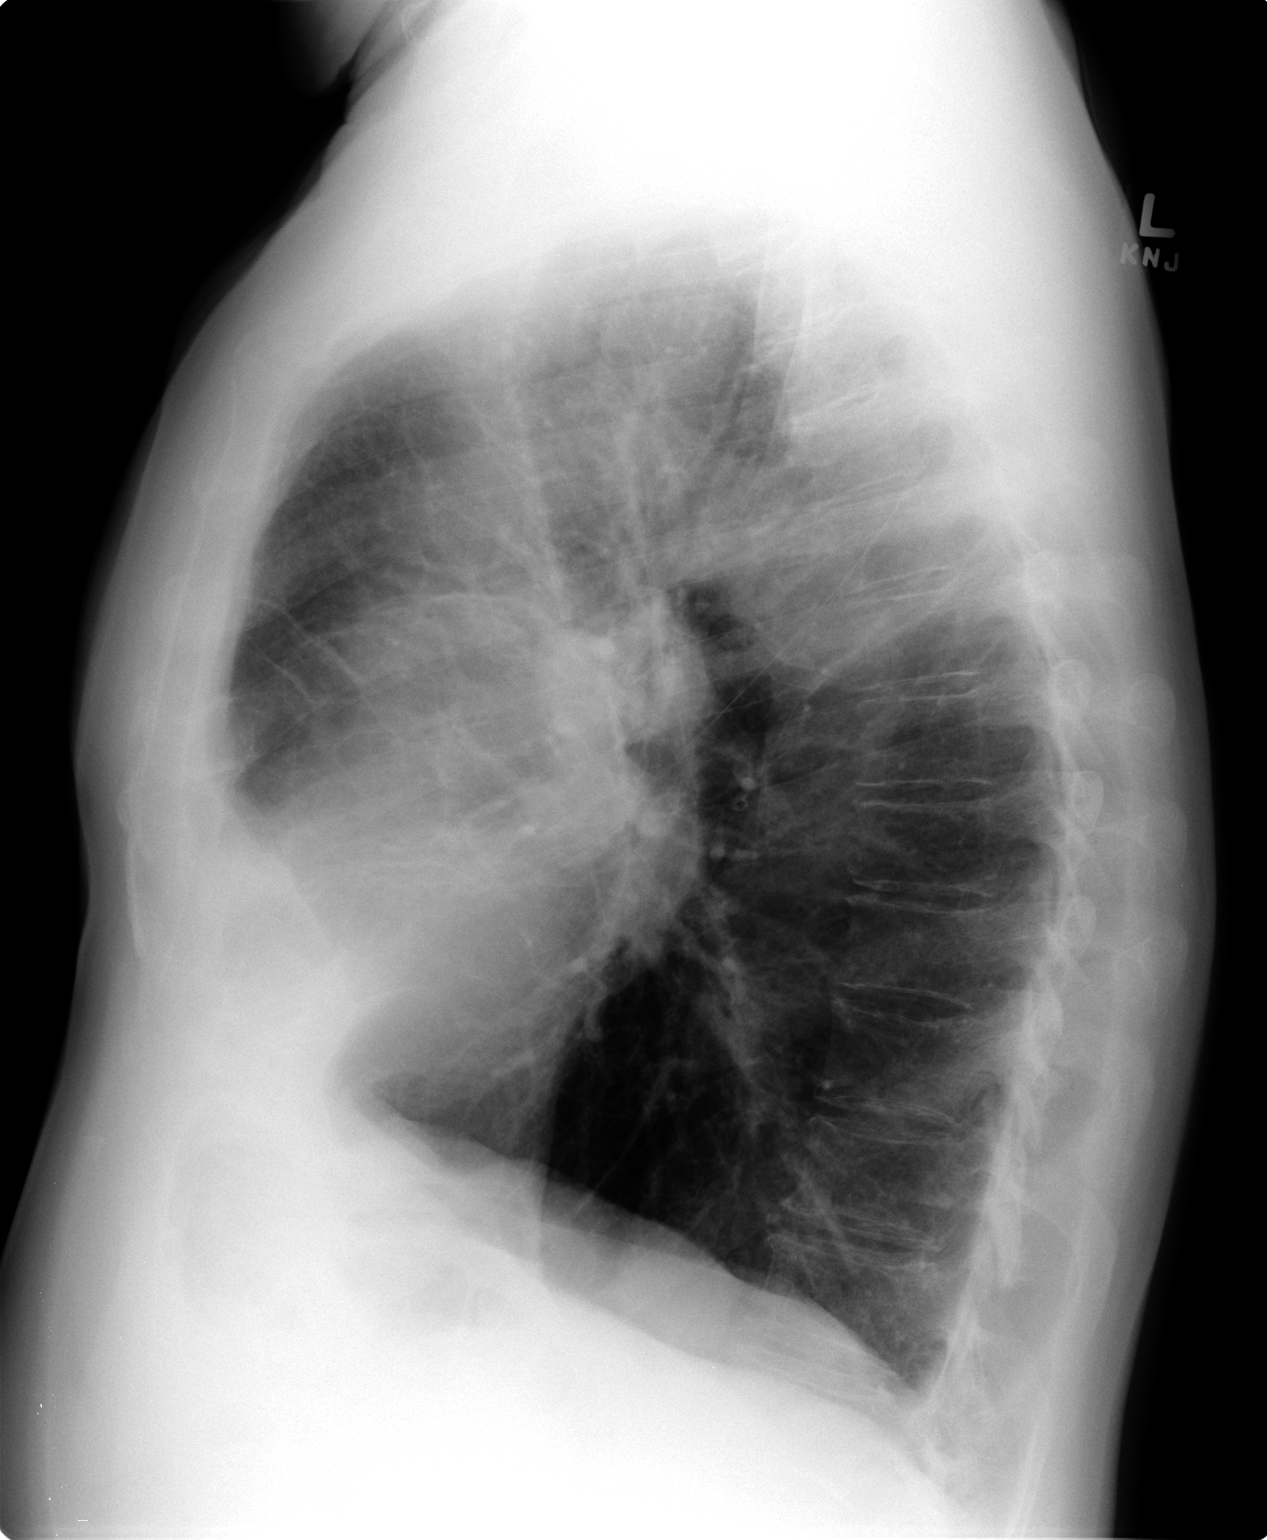

[2 of 2 positions shown; findings below may reference images not displayed]

FINDINGS: Mediastinum and hilar structures are normal. Severe bullous COPD
noted with particular prominent bulla in the right lung base. No
focal acute infiltrates. Heart size and pulmonary vascularity
stable. No significant pleural effusion or pneumothorax. No acute
osseous abnormality. Degenerative changes thoracic spine.
IMPRESSION: Stable chest with severe changes of bullous COPD.

## 2015-04-19 DIAGNOSIS — Z87891 Personal history of nicotine dependence: Secondary | ICD-10-CM | POA: Diagnosis not present

## 2015-04-19 DIAGNOSIS — J9612 Chronic respiratory failure with hypercapnia: Secondary | ICD-10-CM | POA: Diagnosis not present

## 2015-04-19 DIAGNOSIS — J9611 Chronic respiratory failure with hypoxia: Secondary | ICD-10-CM | POA: Diagnosis not present

## 2015-04-19 DIAGNOSIS — J441 Chronic obstructive pulmonary disease with (acute) exacerbation: Secondary | ICD-10-CM | POA: Diagnosis not present

## 2015-04-19 DIAGNOSIS — J479 Bronchiectasis, uncomplicated: Secondary | ICD-10-CM | POA: Diagnosis not present

## 2015-04-19 DIAGNOSIS — E7800 Pure hypercholesterolemia, unspecified: Secondary | ICD-10-CM | POA: Diagnosis not present

## 2015-04-20 DIAGNOSIS — E7800 Pure hypercholesterolemia, unspecified: Secondary | ICD-10-CM | POA: Diagnosis not present

## 2015-04-20 DIAGNOSIS — J479 Bronchiectasis, uncomplicated: Secondary | ICD-10-CM | POA: Diagnosis not present

## 2015-04-20 DIAGNOSIS — Z87891 Personal history of nicotine dependence: Secondary | ICD-10-CM | POA: Diagnosis not present

## 2015-04-20 DIAGNOSIS — J9612 Chronic respiratory failure with hypercapnia: Secondary | ICD-10-CM | POA: Diagnosis not present

## 2015-04-20 DIAGNOSIS — J441 Chronic obstructive pulmonary disease with (acute) exacerbation: Secondary | ICD-10-CM | POA: Diagnosis not present

## 2015-04-20 DIAGNOSIS — J9611 Chronic respiratory failure with hypoxia: Secondary | ICD-10-CM | POA: Diagnosis not present

## 2015-04-22 DIAGNOSIS — E7800 Pure hypercholesterolemia, unspecified: Secondary | ICD-10-CM | POA: Diagnosis not present

## 2015-04-22 DIAGNOSIS — J9612 Chronic respiratory failure with hypercapnia: Secondary | ICD-10-CM | POA: Diagnosis not present

## 2015-04-22 DIAGNOSIS — Z87891 Personal history of nicotine dependence: Secondary | ICD-10-CM | POA: Diagnosis not present

## 2015-04-22 DIAGNOSIS — J9611 Chronic respiratory failure with hypoxia: Secondary | ICD-10-CM | POA: Diagnosis not present

## 2015-04-22 DIAGNOSIS — J479 Bronchiectasis, uncomplicated: Secondary | ICD-10-CM | POA: Diagnosis not present

## 2015-04-22 DIAGNOSIS — J441 Chronic obstructive pulmonary disease with (acute) exacerbation: Secondary | ICD-10-CM | POA: Diagnosis not present

## 2015-04-22 DIAGNOSIS — R52 Pain, unspecified: Secondary | ICD-10-CM | POA: Diagnosis not present

## 2015-04-22 DIAGNOSIS — R0602 Shortness of breath: Secondary | ICD-10-CM | POA: Diagnosis not present

## 2015-04-22 DIAGNOSIS — J449 Chronic obstructive pulmonary disease, unspecified: Secondary | ICD-10-CM | POA: Diagnosis not present

## 2015-04-25 DIAGNOSIS — I519 Heart disease, unspecified: Secondary | ICD-10-CM | POA: Diagnosis not present

## 2015-04-25 DIAGNOSIS — I517 Cardiomegaly: Secondary | ICD-10-CM | POA: Diagnosis not present

## 2015-04-25 DIAGNOSIS — I071 Rheumatic tricuspid insufficiency: Secondary | ICD-10-CM | POA: Diagnosis not present

## 2015-04-26 DIAGNOSIS — J441 Chronic obstructive pulmonary disease with (acute) exacerbation: Secondary | ICD-10-CM | POA: Diagnosis not present

## 2015-04-26 DIAGNOSIS — J9611 Chronic respiratory failure with hypoxia: Secondary | ICD-10-CM | POA: Diagnosis not present

## 2015-04-26 DIAGNOSIS — J479 Bronchiectasis, uncomplicated: Secondary | ICD-10-CM | POA: Diagnosis not present

## 2015-04-26 DIAGNOSIS — J9612 Chronic respiratory failure with hypercapnia: Secondary | ICD-10-CM | POA: Diagnosis not present

## 2015-04-26 DIAGNOSIS — Z87891 Personal history of nicotine dependence: Secondary | ICD-10-CM | POA: Diagnosis not present

## 2015-04-26 DIAGNOSIS — E7800 Pure hypercholesterolemia, unspecified: Secondary | ICD-10-CM | POA: Diagnosis not present

## 2015-05-04 DIAGNOSIS — J9612 Chronic respiratory failure with hypercapnia: Secondary | ICD-10-CM | POA: Diagnosis not present

## 2015-05-04 DIAGNOSIS — Z87891 Personal history of nicotine dependence: Secondary | ICD-10-CM | POA: Diagnosis not present

## 2015-05-04 DIAGNOSIS — J441 Chronic obstructive pulmonary disease with (acute) exacerbation: Secondary | ICD-10-CM | POA: Diagnosis not present

## 2015-05-04 DIAGNOSIS — E7800 Pure hypercholesterolemia, unspecified: Secondary | ICD-10-CM | POA: Diagnosis not present

## 2015-05-04 DIAGNOSIS — J9611 Chronic respiratory failure with hypoxia: Secondary | ICD-10-CM | POA: Diagnosis not present

## 2015-05-04 DIAGNOSIS — J479 Bronchiectasis, uncomplicated: Secondary | ICD-10-CM | POA: Diagnosis not present

## 2015-05-05 DIAGNOSIS — J9611 Chronic respiratory failure with hypoxia: Secondary | ICD-10-CM | POA: Diagnosis not present

## 2015-05-05 DIAGNOSIS — J449 Chronic obstructive pulmonary disease, unspecified: Secondary | ICD-10-CM | POA: Diagnosis not present

## 2015-05-05 DIAGNOSIS — R0600 Dyspnea, unspecified: Secondary | ICD-10-CM | POA: Diagnosis not present

## 2015-05-06 DIAGNOSIS — E7800 Pure hypercholesterolemia, unspecified: Secondary | ICD-10-CM | POA: Diagnosis not present

## 2015-05-06 DIAGNOSIS — J479 Bronchiectasis, uncomplicated: Secondary | ICD-10-CM | POA: Diagnosis not present

## 2015-05-06 DIAGNOSIS — J9612 Chronic respiratory failure with hypercapnia: Secondary | ICD-10-CM | POA: Diagnosis not present

## 2015-05-06 DIAGNOSIS — Z87891 Personal history of nicotine dependence: Secondary | ICD-10-CM | POA: Diagnosis not present

## 2015-05-06 DIAGNOSIS — J9611 Chronic respiratory failure with hypoxia: Secondary | ICD-10-CM | POA: Diagnosis not present

## 2015-05-06 DIAGNOSIS — J441 Chronic obstructive pulmonary disease with (acute) exacerbation: Secondary | ICD-10-CM | POA: Diagnosis not present

## 2015-05-09 DIAGNOSIS — J479 Bronchiectasis, uncomplicated: Secondary | ICD-10-CM | POA: Diagnosis not present

## 2015-05-09 DIAGNOSIS — J9612 Chronic respiratory failure with hypercapnia: Secondary | ICD-10-CM | POA: Diagnosis not present

## 2015-05-09 DIAGNOSIS — Z87891 Personal history of nicotine dependence: Secondary | ICD-10-CM | POA: Diagnosis not present

## 2015-05-09 DIAGNOSIS — J441 Chronic obstructive pulmonary disease with (acute) exacerbation: Secondary | ICD-10-CM | POA: Diagnosis not present

## 2015-05-09 DIAGNOSIS — J9611 Chronic respiratory failure with hypoxia: Secondary | ICD-10-CM | POA: Diagnosis not present

## 2015-05-09 DIAGNOSIS — E7800 Pure hypercholesterolemia, unspecified: Secondary | ICD-10-CM | POA: Diagnosis not present

## 2015-05-11 DIAGNOSIS — Z87891 Personal history of nicotine dependence: Secondary | ICD-10-CM | POA: Diagnosis not present

## 2015-05-11 DIAGNOSIS — J479 Bronchiectasis, uncomplicated: Secondary | ICD-10-CM | POA: Diagnosis not present

## 2015-05-11 DIAGNOSIS — J441 Chronic obstructive pulmonary disease with (acute) exacerbation: Secondary | ICD-10-CM | POA: Diagnosis not present

## 2015-05-11 DIAGNOSIS — E7800 Pure hypercholesterolemia, unspecified: Secondary | ICD-10-CM | POA: Diagnosis not present

## 2015-05-11 DIAGNOSIS — J9612 Chronic respiratory failure with hypercapnia: Secondary | ICD-10-CM | POA: Diagnosis not present

## 2015-05-11 DIAGNOSIS — J9611 Chronic respiratory failure with hypoxia: Secondary | ICD-10-CM | POA: Diagnosis not present

## 2015-05-13 DIAGNOSIS — J9611 Chronic respiratory failure with hypoxia: Secondary | ICD-10-CM | POA: Diagnosis not present

## 2015-05-13 DIAGNOSIS — J9612 Chronic respiratory failure with hypercapnia: Secondary | ICD-10-CM | POA: Diagnosis not present

## 2015-05-13 DIAGNOSIS — J441 Chronic obstructive pulmonary disease with (acute) exacerbation: Secondary | ICD-10-CM | POA: Diagnosis not present

## 2015-05-13 DIAGNOSIS — Z87891 Personal history of nicotine dependence: Secondary | ICD-10-CM | POA: Diagnosis not present

## 2015-05-13 DIAGNOSIS — E7800 Pure hypercholesterolemia, unspecified: Secondary | ICD-10-CM | POA: Diagnosis not present

## 2015-05-13 DIAGNOSIS — J479 Bronchiectasis, uncomplicated: Secondary | ICD-10-CM | POA: Diagnosis not present

## 2015-05-17 DIAGNOSIS — J479 Bronchiectasis, uncomplicated: Secondary | ICD-10-CM | POA: Diagnosis not present

## 2015-05-17 DIAGNOSIS — J9612 Chronic respiratory failure with hypercapnia: Secondary | ICD-10-CM | POA: Diagnosis not present

## 2015-05-17 DIAGNOSIS — E7800 Pure hypercholesterolemia, unspecified: Secondary | ICD-10-CM | POA: Diagnosis not present

## 2015-05-17 DIAGNOSIS — J441 Chronic obstructive pulmonary disease with (acute) exacerbation: Secondary | ICD-10-CM | POA: Diagnosis not present

## 2015-05-17 DIAGNOSIS — Z87891 Personal history of nicotine dependence: Secondary | ICD-10-CM | POA: Diagnosis not present

## 2015-05-17 DIAGNOSIS — J9611 Chronic respiratory failure with hypoxia: Secondary | ICD-10-CM | POA: Diagnosis not present

## 2015-05-18 DIAGNOSIS — Z7951 Long term (current) use of inhaled steroids: Secondary | ICD-10-CM | POA: Diagnosis not present

## 2015-05-18 DIAGNOSIS — Z9981 Dependence on supplemental oxygen: Secondary | ICD-10-CM | POA: Diagnosis not present

## 2015-05-18 DIAGNOSIS — J9612 Chronic respiratory failure with hypercapnia: Secondary | ICD-10-CM | POA: Diagnosis not present

## 2015-05-18 DIAGNOSIS — J441 Chronic obstructive pulmonary disease with (acute) exacerbation: Secondary | ICD-10-CM | POA: Diagnosis not present

## 2015-05-18 DIAGNOSIS — J9611 Chronic respiratory failure with hypoxia: Secondary | ICD-10-CM | POA: Diagnosis not present

## 2015-05-18 DIAGNOSIS — E7800 Pure hypercholesterolemia, unspecified: Secondary | ICD-10-CM | POA: Diagnosis not present

## 2015-05-18 DIAGNOSIS — Z87891 Personal history of nicotine dependence: Secondary | ICD-10-CM | POA: Diagnosis not present

## 2015-05-18 DIAGNOSIS — J479 Bronchiectasis, uncomplicated: Secondary | ICD-10-CM | POA: Diagnosis not present

## 2015-05-28 DIAGNOSIS — J9611 Chronic respiratory failure with hypoxia: Secondary | ICD-10-CM | POA: Diagnosis not present

## 2015-05-28 DIAGNOSIS — J441 Chronic obstructive pulmonary disease with (acute) exacerbation: Secondary | ICD-10-CM | POA: Diagnosis not present

## 2015-05-28 DIAGNOSIS — J479 Bronchiectasis, uncomplicated: Secondary | ICD-10-CM | POA: Diagnosis not present

## 2015-05-28 DIAGNOSIS — E7800 Pure hypercholesterolemia, unspecified: Secondary | ICD-10-CM | POA: Diagnosis not present

## 2015-05-28 DIAGNOSIS — Z87891 Personal history of nicotine dependence: Secondary | ICD-10-CM | POA: Diagnosis not present

## 2015-05-28 DIAGNOSIS — J9612 Chronic respiratory failure with hypercapnia: Secondary | ICD-10-CM | POA: Diagnosis not present

## 2015-06-02 DIAGNOSIS — J479 Bronchiectasis, uncomplicated: Secondary | ICD-10-CM | POA: Diagnosis not present

## 2015-06-02 DIAGNOSIS — J9612 Chronic respiratory failure with hypercapnia: Secondary | ICD-10-CM | POA: Diagnosis not present

## 2015-06-02 DIAGNOSIS — R0600 Dyspnea, unspecified: Secondary | ICD-10-CM | POA: Diagnosis not present

## 2015-06-02 DIAGNOSIS — J441 Chronic obstructive pulmonary disease with (acute) exacerbation: Secondary | ICD-10-CM | POA: Diagnosis not present

## 2015-06-02 DIAGNOSIS — J449 Chronic obstructive pulmonary disease, unspecified: Secondary | ICD-10-CM | POA: Diagnosis not present

## 2015-06-02 DIAGNOSIS — J9611 Chronic respiratory failure with hypoxia: Secondary | ICD-10-CM | POA: Diagnosis not present

## 2015-06-02 DIAGNOSIS — Z87891 Personal history of nicotine dependence: Secondary | ICD-10-CM | POA: Diagnosis not present

## 2015-06-02 DIAGNOSIS — E7800 Pure hypercholesterolemia, unspecified: Secondary | ICD-10-CM | POA: Diagnosis not present

## 2015-06-08 DIAGNOSIS — R0902 Hypoxemia: Secondary | ICD-10-CM | POA: Diagnosis not present

## 2015-06-08 DIAGNOSIS — J939 Pneumothorax, unspecified: Secondary | ICD-10-CM | POA: Diagnosis not present

## 2015-06-08 DIAGNOSIS — J449 Chronic obstructive pulmonary disease, unspecified: Secondary | ICD-10-CM | POA: Diagnosis not present

## 2015-06-08 DIAGNOSIS — J45909 Unspecified asthma, uncomplicated: Secondary | ICD-10-CM | POA: Diagnosis not present

## 2015-06-09 DIAGNOSIS — J441 Chronic obstructive pulmonary disease with (acute) exacerbation: Secondary | ICD-10-CM | POA: Diagnosis not present

## 2015-06-09 DIAGNOSIS — J9611 Chronic respiratory failure with hypoxia: Secondary | ICD-10-CM | POA: Diagnosis not present

## 2015-06-09 DIAGNOSIS — J9612 Chronic respiratory failure with hypercapnia: Secondary | ICD-10-CM | POA: Diagnosis not present

## 2015-06-09 DIAGNOSIS — E7800 Pure hypercholesterolemia, unspecified: Secondary | ICD-10-CM | POA: Diagnosis not present

## 2015-06-09 DIAGNOSIS — Z87891 Personal history of nicotine dependence: Secondary | ICD-10-CM | POA: Diagnosis not present

## 2015-06-09 DIAGNOSIS — J479 Bronchiectasis, uncomplicated: Secondary | ICD-10-CM | POA: Diagnosis not present

## 2015-06-13 DIAGNOSIS — J479 Bronchiectasis, uncomplicated: Secondary | ICD-10-CM | POA: Diagnosis not present

## 2015-06-13 DIAGNOSIS — E7800 Pure hypercholesterolemia, unspecified: Secondary | ICD-10-CM | POA: Diagnosis not present

## 2015-06-13 DIAGNOSIS — J9611 Chronic respiratory failure with hypoxia: Secondary | ICD-10-CM | POA: Diagnosis not present

## 2015-06-13 DIAGNOSIS — J9612 Chronic respiratory failure with hypercapnia: Secondary | ICD-10-CM | POA: Diagnosis not present

## 2015-06-13 DIAGNOSIS — J441 Chronic obstructive pulmonary disease with (acute) exacerbation: Secondary | ICD-10-CM | POA: Diagnosis not present

## 2015-06-13 DIAGNOSIS — Z87891 Personal history of nicotine dependence: Secondary | ICD-10-CM | POA: Diagnosis not present

## 2015-06-28 DIAGNOSIS — J9611 Chronic respiratory failure with hypoxia: Secondary | ICD-10-CM | POA: Diagnosis not present

## 2015-06-28 DIAGNOSIS — J441 Chronic obstructive pulmonary disease with (acute) exacerbation: Secondary | ICD-10-CM | POA: Diagnosis not present

## 2015-06-28 DIAGNOSIS — Z87891 Personal history of nicotine dependence: Secondary | ICD-10-CM | POA: Diagnosis not present

## 2015-06-28 DIAGNOSIS — E7800 Pure hypercholesterolemia, unspecified: Secondary | ICD-10-CM | POA: Diagnosis not present

## 2015-06-28 DIAGNOSIS — J479 Bronchiectasis, uncomplicated: Secondary | ICD-10-CM | POA: Diagnosis not present

## 2015-06-28 DIAGNOSIS — J9612 Chronic respiratory failure with hypercapnia: Secondary | ICD-10-CM | POA: Diagnosis not present

## 2015-07-07 DIAGNOSIS — J441 Chronic obstructive pulmonary disease with (acute) exacerbation: Secondary | ICD-10-CM | POA: Diagnosis not present

## 2015-07-07 DIAGNOSIS — J9611 Chronic respiratory failure with hypoxia: Secondary | ICD-10-CM | POA: Diagnosis not present

## 2015-07-07 DIAGNOSIS — J9612 Chronic respiratory failure with hypercapnia: Secondary | ICD-10-CM | POA: Diagnosis not present

## 2015-07-07 DIAGNOSIS — E7800 Pure hypercholesterolemia, unspecified: Secondary | ICD-10-CM | POA: Diagnosis not present

## 2015-07-07 DIAGNOSIS — J479 Bronchiectasis, uncomplicated: Secondary | ICD-10-CM | POA: Diagnosis not present

## 2015-07-07 DIAGNOSIS — Z87891 Personal history of nicotine dependence: Secondary | ICD-10-CM | POA: Diagnosis not present

## 2015-07-15 DIAGNOSIS — J9611 Chronic respiratory failure with hypoxia: Secondary | ICD-10-CM | POA: Diagnosis not present

## 2015-07-15 DIAGNOSIS — J9612 Chronic respiratory failure with hypercapnia: Secondary | ICD-10-CM | POA: Diagnosis not present

## 2015-07-15 DIAGNOSIS — R0602 Shortness of breath: Secondary | ICD-10-CM | POA: Diagnosis not present

## 2015-07-15 DIAGNOSIS — J441 Chronic obstructive pulmonary disease with (acute) exacerbation: Secondary | ICD-10-CM | POA: Diagnosis not present

## 2015-07-15 DIAGNOSIS — J479 Bronchiectasis, uncomplicated: Secondary | ICD-10-CM | POA: Diagnosis not present

## 2015-07-15 DIAGNOSIS — E7800 Pure hypercholesterolemia, unspecified: Secondary | ICD-10-CM | POA: Diagnosis not present

## 2015-07-15 DIAGNOSIS — R52 Pain, unspecified: Secondary | ICD-10-CM | POA: Diagnosis not present

## 2015-07-15 DIAGNOSIS — J449 Chronic obstructive pulmonary disease, unspecified: Secondary | ICD-10-CM | POA: Diagnosis not present

## 2015-07-15 DIAGNOSIS — Z87891 Personal history of nicotine dependence: Secondary | ICD-10-CM | POA: Diagnosis not present

## 2015-07-20 DIAGNOSIS — E785 Hyperlipidemia, unspecified: Secondary | ICD-10-CM | POA: Diagnosis not present

## 2015-07-20 DIAGNOSIS — B351 Tinea unguium: Secondary | ICD-10-CM | POA: Diagnosis not present

## 2015-07-20 DIAGNOSIS — Z7952 Long term (current) use of systemic steroids: Secondary | ICD-10-CM | POA: Diagnosis not present

## 2015-07-20 DIAGNOSIS — J449 Chronic obstructive pulmonary disease, unspecified: Secondary | ICD-10-CM | POA: Diagnosis not present

## 2015-07-20 DIAGNOSIS — Z125 Encounter for screening for malignant neoplasm of prostate: Secondary | ICD-10-CM | POA: Diagnosis not present

## 2015-07-20 DIAGNOSIS — Z0000 Encounter for general adult medical examination without abnormal findings: Secondary | ICD-10-CM | POA: Diagnosis not present

## 2015-08-03 DIAGNOSIS — J449 Chronic obstructive pulmonary disease, unspecified: Secondary | ICD-10-CM | POA: Diagnosis not present

## 2015-08-03 DIAGNOSIS — R64 Cachexia: Secondary | ICD-10-CM | POA: Diagnosis not present

## 2015-08-03 DIAGNOSIS — R0600 Dyspnea, unspecified: Secondary | ICD-10-CM | POA: Diagnosis not present

## 2015-09-06 DIAGNOSIS — Z7952 Long term (current) use of systemic steroids: Secondary | ICD-10-CM | POA: Diagnosis not present

## 2015-09-06 DIAGNOSIS — M8589 Other specified disorders of bone density and structure, multiple sites: Secondary | ICD-10-CM | POA: Diagnosis not present

## 2015-09-08 DIAGNOSIS — J984 Other disorders of lung: Secondary | ICD-10-CM | POA: Diagnosis not present

## 2015-09-08 DIAGNOSIS — J449 Chronic obstructive pulmonary disease, unspecified: Secondary | ICD-10-CM | POA: Diagnosis not present

## 2015-09-08 DIAGNOSIS — R918 Other nonspecific abnormal finding of lung field: Secondary | ICD-10-CM | POA: Diagnosis not present

## 2015-09-09 DIAGNOSIS — R64 Cachexia: Secondary | ICD-10-CM | POA: Diagnosis not present

## 2015-09-09 DIAGNOSIS — R0600 Dyspnea, unspecified: Secondary | ICD-10-CM | POA: Diagnosis not present

## 2015-09-09 DIAGNOSIS — J449 Chronic obstructive pulmonary disease, unspecified: Secondary | ICD-10-CM | POA: Diagnosis not present

## 2015-09-21 DIAGNOSIS — J449 Chronic obstructive pulmonary disease, unspecified: Secondary | ICD-10-CM | POA: Diagnosis not present

## 2015-09-21 DIAGNOSIS — R509 Fever, unspecified: Secondary | ICD-10-CM | POA: Diagnosis not present

## 2015-09-21 DIAGNOSIS — M8580 Other specified disorders of bone density and structure, unspecified site: Secondary | ICD-10-CM | POA: Diagnosis not present

## 2015-09-22 DIAGNOSIS — R509 Fever, unspecified: Secondary | ICD-10-CM | POA: Diagnosis not present

## 2015-09-22 DIAGNOSIS — J449 Chronic obstructive pulmonary disease, unspecified: Secondary | ICD-10-CM | POA: Diagnosis not present

## 2015-11-14 DIAGNOSIS — L03115 Cellulitis of right lower limb: Secondary | ICD-10-CM | POA: Diagnosis not present

## 2015-11-14 DIAGNOSIS — B079 Viral wart, unspecified: Secondary | ICD-10-CM | POA: Diagnosis not present

## 2015-11-21 DIAGNOSIS — L03119 Cellulitis of unspecified part of limb: Secondary | ICD-10-CM | POA: Diagnosis not present

## 2015-11-21 DIAGNOSIS — L218 Other seborrheic dermatitis: Secondary | ICD-10-CM | POA: Diagnosis not present

## 2015-11-22 DIAGNOSIS — R0602 Shortness of breath: Secondary | ICD-10-CM | POA: Diagnosis not present

## 2015-11-22 DIAGNOSIS — R52 Pain, unspecified: Secondary | ICD-10-CM | POA: Diagnosis not present

## 2015-11-22 DIAGNOSIS — J449 Chronic obstructive pulmonary disease, unspecified: Secondary | ICD-10-CM | POA: Diagnosis not present

## 2015-11-30 DIAGNOSIS — J431 Panlobular emphysema: Secondary | ICD-10-CM | POA: Diagnosis not present

## 2015-11-30 DIAGNOSIS — J449 Chronic obstructive pulmonary disease, unspecified: Secondary | ICD-10-CM | POA: Diagnosis not present

## 2015-11-30 DIAGNOSIS — R918 Other nonspecific abnormal finding of lung field: Secondary | ICD-10-CM | POA: Diagnosis not present

## 2015-12-05 DIAGNOSIS — J449 Chronic obstructive pulmonary disease, unspecified: Secondary | ICD-10-CM | POA: Diagnosis not present

## 2015-12-05 DIAGNOSIS — R509 Fever, unspecified: Secondary | ICD-10-CM | POA: Diagnosis not present

## 2015-12-27 DIAGNOSIS — J449 Chronic obstructive pulmonary disease, unspecified: Secondary | ICD-10-CM | POA: Diagnosis not present

## 2015-12-27 DIAGNOSIS — J9611 Chronic respiratory failure with hypoxia: Secondary | ICD-10-CM | POA: Diagnosis not present

## 2015-12-27 DIAGNOSIS — J441 Chronic obstructive pulmonary disease with (acute) exacerbation: Secondary | ICD-10-CM | POA: Diagnosis not present

## 2015-12-27 DIAGNOSIS — R64 Cachexia: Secondary | ICD-10-CM | POA: Diagnosis not present

## 2016-01-20 DIAGNOSIS — G4730 Sleep apnea, unspecified: Secondary | ICD-10-CM | POA: Diagnosis not present

## 2016-01-20 DIAGNOSIS — J431 Panlobular emphysema: Secondary | ICD-10-CM | POA: Diagnosis not present

## 2016-01-20 DIAGNOSIS — Z87891 Personal history of nicotine dependence: Secondary | ICD-10-CM | POA: Diagnosis not present

## 2016-01-20 DIAGNOSIS — I714 Abdominal aortic aneurysm, without rupture: Secondary | ICD-10-CM | POA: Diagnosis not present

## 2016-01-20 DIAGNOSIS — M069 Rheumatoid arthritis, unspecified: Secondary | ICD-10-CM | POA: Diagnosis not present

## 2016-01-20 DIAGNOSIS — G8929 Other chronic pain: Secondary | ICD-10-CM | POA: Diagnosis not present

## 2016-01-20 DIAGNOSIS — R918 Other nonspecific abnormal finding of lung field: Secondary | ICD-10-CM | POA: Diagnosis not present

## 2016-01-31 DIAGNOSIS — L600 Ingrowing nail: Secondary | ICD-10-CM | POA: Diagnosis not present

## 2016-01-31 DIAGNOSIS — L03032 Cellulitis of left toe: Secondary | ICD-10-CM | POA: Diagnosis not present

## 2016-04-03 DIAGNOSIS — R7309 Other abnormal glucose: Secondary | ICD-10-CM | POA: Diagnosis not present

## 2016-04-03 DIAGNOSIS — R7301 Impaired fasting glucose: Secondary | ICD-10-CM | POA: Diagnosis not present

## 2016-04-03 DIAGNOSIS — R202 Paresthesia of skin: Secondary | ICD-10-CM | POA: Diagnosis not present

## 2016-04-03 DIAGNOSIS — Z7952 Long term (current) use of systemic steroids: Secondary | ICD-10-CM | POA: Diagnosis not present

## 2016-04-03 DIAGNOSIS — R609 Edema, unspecified: Secondary | ICD-10-CM | POA: Diagnosis not present

## 2016-04-18 DIAGNOSIS — E0840 Diabetes mellitus due to underlying condition with diabetic neuropathy, unspecified: Secondary | ICD-10-CM | POA: Diagnosis not present

## 2016-04-18 DIAGNOSIS — H539 Unspecified visual disturbance: Secondary | ICD-10-CM | POA: Diagnosis not present

## 2016-04-18 DIAGNOSIS — Z23 Encounter for immunization: Secondary | ICD-10-CM | POA: Diagnosis not present

## 2016-04-18 DIAGNOSIS — S0502XA Injury of conjunctiva and corneal abrasion without foreign body, left eye, initial encounter: Secondary | ICD-10-CM | POA: Diagnosis not present

## 2016-04-20 DIAGNOSIS — H25813 Combined forms of age-related cataract, bilateral: Secondary | ICD-10-CM | POA: Diagnosis not present

## 2016-04-20 DIAGNOSIS — H01006 Unspecified blepharitis left eye, unspecified eyelid: Secondary | ICD-10-CM | POA: Diagnosis not present

## 2016-04-20 DIAGNOSIS — E119 Type 2 diabetes mellitus without complications: Secondary | ICD-10-CM | POA: Diagnosis not present

## 2016-04-20 DIAGNOSIS — H40033 Anatomical narrow angle, bilateral: Secondary | ICD-10-CM | POA: Diagnosis not present

## 2016-04-20 DIAGNOSIS — H43813 Vitreous degeneration, bilateral: Secondary | ICD-10-CM | POA: Diagnosis not present

## 2016-06-04 DIAGNOSIS — H01006 Unspecified blepharitis left eye, unspecified eyelid: Secondary | ICD-10-CM | POA: Diagnosis not present

## 2016-06-04 DIAGNOSIS — H43813 Vitreous degeneration, bilateral: Secondary | ICD-10-CM | POA: Diagnosis not present

## 2016-06-04 DIAGNOSIS — H527 Unspecified disorder of refraction: Secondary | ICD-10-CM | POA: Diagnosis not present

## 2016-06-04 DIAGNOSIS — H25813 Combined forms of age-related cataract, bilateral: Secondary | ICD-10-CM | POA: Diagnosis not present

## 2016-06-04 DIAGNOSIS — E119 Type 2 diabetes mellitus without complications: Secondary | ICD-10-CM | POA: Diagnosis not present

## 2016-06-25 DIAGNOSIS — J9611 Chronic respiratory failure with hypoxia: Secondary | ICD-10-CM | POA: Diagnosis not present

## 2016-06-25 DIAGNOSIS — R64 Cachexia: Secondary | ICD-10-CM | POA: Diagnosis not present

## 2016-06-25 DIAGNOSIS — J449 Chronic obstructive pulmonary disease, unspecified: Secondary | ICD-10-CM | POA: Diagnosis not present

## 2016-06-27 DIAGNOSIS — R52 Pain, unspecified: Secondary | ICD-10-CM | POA: Diagnosis not present

## 2016-06-27 DIAGNOSIS — H25813 Combined forms of age-related cataract, bilateral: Secondary | ICD-10-CM | POA: Diagnosis not present

## 2016-06-27 DIAGNOSIS — J449 Chronic obstructive pulmonary disease, unspecified: Secondary | ICD-10-CM | POA: Diagnosis not present

## 2016-06-27 DIAGNOSIS — R0602 Shortness of breath: Secondary | ICD-10-CM | POA: Diagnosis not present

## 2016-07-05 DIAGNOSIS — Z7951 Long term (current) use of inhaled steroids: Secondary | ICD-10-CM | POA: Diagnosis not present

## 2016-07-05 DIAGNOSIS — H25813 Combined forms of age-related cataract, bilateral: Secondary | ICD-10-CM | POA: Diagnosis not present

## 2016-07-05 DIAGNOSIS — Z79899 Other long term (current) drug therapy: Secondary | ICD-10-CM | POA: Diagnosis not present

## 2016-07-05 DIAGNOSIS — Z9981 Dependence on supplemental oxygen: Secondary | ICD-10-CM | POA: Diagnosis not present

## 2016-07-05 DIAGNOSIS — E785 Hyperlipidemia, unspecified: Secondary | ICD-10-CM | POA: Diagnosis not present

## 2016-07-05 DIAGNOSIS — H25811 Combined forms of age-related cataract, right eye: Secondary | ICD-10-CM | POA: Diagnosis not present

## 2016-07-05 DIAGNOSIS — M069 Rheumatoid arthritis, unspecified: Secondary | ICD-10-CM | POA: Diagnosis not present

## 2016-07-05 DIAGNOSIS — E119 Type 2 diabetes mellitus without complications: Secondary | ICD-10-CM | POA: Diagnosis not present

## 2016-07-05 DIAGNOSIS — J449 Chronic obstructive pulmonary disease, unspecified: Secondary | ICD-10-CM | POA: Diagnosis not present

## 2016-07-05 DIAGNOSIS — H40033 Anatomical narrow angle, bilateral: Secondary | ICD-10-CM | POA: Diagnosis not present

## 2016-07-05 DIAGNOSIS — Z808 Family history of malignant neoplasm of other organs or systems: Secondary | ICD-10-CM | POA: Diagnosis not present

## 2016-07-05 DIAGNOSIS — H43813 Vitreous degeneration, bilateral: Secondary | ICD-10-CM | POA: Diagnosis not present

## 2016-07-05 DIAGNOSIS — M810 Age-related osteoporosis without current pathological fracture: Secondary | ICD-10-CM | POA: Diagnosis not present

## 2016-07-05 DIAGNOSIS — Z87891 Personal history of nicotine dependence: Secondary | ICD-10-CM | POA: Diagnosis not present

## 2016-07-05 DIAGNOSIS — H2181 Floppy iris syndrome: Secondary | ICD-10-CM | POA: Diagnosis not present

## 2016-07-05 DIAGNOSIS — R0602 Shortness of breath: Secondary | ICD-10-CM | POA: Diagnosis not present

## 2016-07-06 DIAGNOSIS — E119 Type 2 diabetes mellitus without complications: Secondary | ICD-10-CM | POA: Diagnosis not present

## 2016-07-06 DIAGNOSIS — H43813 Vitreous degeneration, bilateral: Secondary | ICD-10-CM | POA: Diagnosis not present

## 2016-07-06 DIAGNOSIS — H25812 Combined forms of age-related cataract, left eye: Secondary | ICD-10-CM | POA: Diagnosis not present

## 2016-07-06 DIAGNOSIS — H01006 Unspecified blepharitis left eye, unspecified eyelid: Secondary | ICD-10-CM | POA: Diagnosis not present

## 2016-07-12 DIAGNOSIS — M069 Rheumatoid arthritis, unspecified: Secondary | ICD-10-CM | POA: Diagnosis not present

## 2016-07-12 DIAGNOSIS — Z87891 Personal history of nicotine dependence: Secondary | ICD-10-CM | POA: Diagnosis not present

## 2016-07-12 DIAGNOSIS — E785 Hyperlipidemia, unspecified: Secondary | ICD-10-CM | POA: Diagnosis not present

## 2016-07-12 DIAGNOSIS — J449 Chronic obstructive pulmonary disease, unspecified: Secondary | ICD-10-CM | POA: Diagnosis not present

## 2016-07-12 DIAGNOSIS — E119 Type 2 diabetes mellitus without complications: Secondary | ICD-10-CM | POA: Diagnosis not present

## 2016-07-12 DIAGNOSIS — Z79899 Other long term (current) drug therapy: Secondary | ICD-10-CM | POA: Diagnosis not present

## 2016-07-12 DIAGNOSIS — H25812 Combined forms of age-related cataract, left eye: Secondary | ICD-10-CM | POA: Diagnosis not present

## 2016-07-13 DIAGNOSIS — H01006 Unspecified blepharitis left eye, unspecified eyelid: Secondary | ICD-10-CM | POA: Diagnosis not present

## 2016-07-13 DIAGNOSIS — Z961 Presence of intraocular lens: Secondary | ICD-10-CM | POA: Diagnosis not present

## 2016-07-13 DIAGNOSIS — H43813 Vitreous degeneration, bilateral: Secondary | ICD-10-CM | POA: Diagnosis not present

## 2016-07-13 DIAGNOSIS — E119 Type 2 diabetes mellitus without complications: Secondary | ICD-10-CM | POA: Diagnosis not present

## 2016-07-24 DIAGNOSIS — Z961 Presence of intraocular lens: Secondary | ICD-10-CM | POA: Diagnosis not present

## 2016-07-27 DIAGNOSIS — Z961 Presence of intraocular lens: Secondary | ICD-10-CM | POA: Diagnosis not present

## 2016-09-06 DIAGNOSIS — H16223 Keratoconjunctivitis sicca, not specified as Sjogren's, bilateral: Secondary | ICD-10-CM | POA: Diagnosis not present

## 2016-09-06 DIAGNOSIS — E119 Type 2 diabetes mellitus without complications: Secondary | ICD-10-CM | POA: Diagnosis not present

## 2016-09-06 DIAGNOSIS — Z961 Presence of intraocular lens: Secondary | ICD-10-CM | POA: Diagnosis not present

## 2016-09-06 DIAGNOSIS — H43813 Vitreous degeneration, bilateral: Secondary | ICD-10-CM | POA: Diagnosis not present

## 2016-09-14 DIAGNOSIS — R609 Edema, unspecified: Secondary | ICD-10-CM | POA: Diagnosis not present

## 2016-09-14 DIAGNOSIS — E1165 Type 2 diabetes mellitus with hyperglycemia: Secondary | ICD-10-CM | POA: Diagnosis not present

## 2016-09-14 DIAGNOSIS — Z125 Encounter for screening for malignant neoplasm of prostate: Secondary | ICD-10-CM | POA: Diagnosis not present

## 2016-09-14 DIAGNOSIS — Z0000 Encounter for general adult medical examination without abnormal findings: Secondary | ICD-10-CM | POA: Diagnosis not present

## 2016-09-14 DIAGNOSIS — E785 Hyperlipidemia, unspecified: Secondary | ICD-10-CM | POA: Diagnosis not present

## 2016-09-14 DIAGNOSIS — R202 Paresthesia of skin: Secondary | ICD-10-CM | POA: Diagnosis not present

## 2016-09-14 DIAGNOSIS — Z7984 Long term (current) use of oral hypoglycemic drugs: Secondary | ICD-10-CM | POA: Diagnosis not present

## 2016-09-21 DIAGNOSIS — Z961 Presence of intraocular lens: Secondary | ICD-10-CM | POA: Diagnosis not present

## 2016-09-21 DIAGNOSIS — H16223 Keratoconjunctivitis sicca, not specified as Sjogren's, bilateral: Secondary | ICD-10-CM | POA: Diagnosis not present

## 2016-10-18 DIAGNOSIS — J449 Chronic obstructive pulmonary disease, unspecified: Secondary | ICD-10-CM | POA: Diagnosis not present

## 2016-10-18 DIAGNOSIS — J9611 Chronic respiratory failure with hypoxia: Secondary | ICD-10-CM | POA: Diagnosis not present

## 2016-10-18 DIAGNOSIS — R202 Paresthesia of skin: Secondary | ICD-10-CM | POA: Diagnosis not present

## 2016-10-18 DIAGNOSIS — Z0000 Encounter for general adult medical examination without abnormal findings: Secondary | ICD-10-CM | POA: Diagnosis not present

## 2016-10-18 DIAGNOSIS — R0600 Dyspnea, unspecified: Secondary | ICD-10-CM | POA: Diagnosis not present

## 2016-10-18 DIAGNOSIS — Z125 Encounter for screening for malignant neoplasm of prostate: Secondary | ICD-10-CM | POA: Diagnosis not present

## 2016-10-18 DIAGNOSIS — E785 Hyperlipidemia, unspecified: Secondary | ICD-10-CM | POA: Diagnosis not present

## 2016-10-18 DIAGNOSIS — E0840 Diabetes mellitus due to underlying condition with diabetic neuropathy, unspecified: Secondary | ICD-10-CM | POA: Diagnosis not present

## 2016-10-18 DIAGNOSIS — J441 Chronic obstructive pulmonary disease with (acute) exacerbation: Secondary | ICD-10-CM | POA: Diagnosis not present

## 2016-10-18 DIAGNOSIS — R609 Edema, unspecified: Secondary | ICD-10-CM | POA: Diagnosis not present

## 2017-01-16 DIAGNOSIS — R0602 Shortness of breath: Secondary | ICD-10-CM | POA: Diagnosis not present

## 2017-01-16 DIAGNOSIS — R52 Pain, unspecified: Secondary | ICD-10-CM | POA: Diagnosis not present

## 2017-01-16 DIAGNOSIS — J449 Chronic obstructive pulmonary disease, unspecified: Secondary | ICD-10-CM | POA: Diagnosis not present

## 2017-01-31 DIAGNOSIS — J9611 Chronic respiratory failure with hypoxia: Secondary | ICD-10-CM | POA: Diagnosis not present

## 2017-01-31 DIAGNOSIS — R5381 Other malaise: Secondary | ICD-10-CM | POA: Diagnosis not present

## 2017-01-31 DIAGNOSIS — Z87891 Personal history of nicotine dependence: Secondary | ICD-10-CM | POA: Diagnosis not present

## 2017-01-31 DIAGNOSIS — J449 Chronic obstructive pulmonary disease, unspecified: Secondary | ICD-10-CM | POA: Diagnosis not present

## 2017-02-04 DIAGNOSIS — Z87891 Personal history of nicotine dependence: Secondary | ICD-10-CM | POA: Diagnosis not present

## 2017-03-09 DIAGNOSIS — Z23 Encounter for immunization: Secondary | ICD-10-CM | POA: Diagnosis not present

## 2017-04-12 DIAGNOSIS — R0602 Shortness of breath: Secondary | ICD-10-CM | POA: Diagnosis not present

## 2017-04-12 DIAGNOSIS — R52 Pain, unspecified: Secondary | ICD-10-CM | POA: Diagnosis not present

## 2017-04-12 DIAGNOSIS — J449 Chronic obstructive pulmonary disease, unspecified: Secondary | ICD-10-CM | POA: Diagnosis not present

## 2017-04-23 DIAGNOSIS — L600 Ingrowing nail: Secondary | ICD-10-CM | POA: Diagnosis not present

## 2017-04-23 DIAGNOSIS — L03031 Cellulitis of right toe: Secondary | ICD-10-CM | POA: Diagnosis not present

## 2017-07-03 DIAGNOSIS — L600 Ingrowing nail: Secondary | ICD-10-CM | POA: Diagnosis not present

## 2017-07-03 DIAGNOSIS — L03031 Cellulitis of right toe: Secondary | ICD-10-CM | POA: Diagnosis not present

## 2017-08-01 DIAGNOSIS — J9611 Chronic respiratory failure with hypoxia: Secondary | ICD-10-CM | POA: Diagnosis not present

## 2017-08-01 DIAGNOSIS — E669 Obesity, unspecified: Secondary | ICD-10-CM | POA: Diagnosis not present

## 2017-08-01 DIAGNOSIS — J449 Chronic obstructive pulmonary disease, unspecified: Secondary | ICD-10-CM | POA: Diagnosis not present

## 2017-08-26 DIAGNOSIS — E0840 Diabetes mellitus due to underlying condition with diabetic neuropathy, unspecified: Secondary | ICD-10-CM | POA: Diagnosis not present

## 2017-08-26 DIAGNOSIS — E119 Type 2 diabetes mellitus without complications: Secondary | ICD-10-CM | POA: Diagnosis not present

## 2017-08-26 DIAGNOSIS — M8580 Other specified disorders of bone density and structure, unspecified site: Secondary | ICD-10-CM | POA: Diagnosis not present

## 2017-08-26 DIAGNOSIS — I2510 Atherosclerotic heart disease of native coronary artery without angina pectoris: Secondary | ICD-10-CM | POA: Diagnosis not present

## 2017-08-26 DIAGNOSIS — Z125 Encounter for screening for malignant neoplasm of prostate: Secondary | ICD-10-CM | POA: Diagnosis not present

## 2017-08-26 DIAGNOSIS — E785 Hyperlipidemia, unspecified: Secondary | ICD-10-CM | POA: Diagnosis not present

## 2017-08-26 DIAGNOSIS — M069 Rheumatoid arthritis, unspecified: Secondary | ICD-10-CM | POA: Diagnosis not present

## 2017-08-26 DIAGNOSIS — J449 Chronic obstructive pulmonary disease, unspecified: Secondary | ICD-10-CM | POA: Diagnosis not present

## 2017-08-26 DIAGNOSIS — Z7952 Long term (current) use of systemic steroids: Secondary | ICD-10-CM | POA: Diagnosis not present

## 2017-09-09 DIAGNOSIS — Z79891 Long term (current) use of opiate analgesic: Secondary | ICD-10-CM | POA: Diagnosis not present

## 2017-09-09 DIAGNOSIS — J9611 Chronic respiratory failure with hypoxia: Secondary | ICD-10-CM | POA: Diagnosis not present

## 2017-09-09 DIAGNOSIS — Z7984 Long term (current) use of oral hypoglycemic drugs: Secondary | ICD-10-CM | POA: Diagnosis not present

## 2017-09-09 DIAGNOSIS — Z792 Long term (current) use of antibiotics: Secondary | ICD-10-CM | POA: Diagnosis not present

## 2017-09-09 DIAGNOSIS — Z9981 Dependence on supplemental oxygen: Secondary | ICD-10-CM | POA: Diagnosis not present

## 2017-09-09 DIAGNOSIS — F17211 Nicotine dependence, cigarettes, in remission: Secondary | ICD-10-CM | POA: Diagnosis not present

## 2017-09-09 DIAGNOSIS — R7303 Prediabetes: Secondary | ICD-10-CM | POA: Diagnosis not present

## 2017-09-09 DIAGNOSIS — J449 Chronic obstructive pulmonary disease, unspecified: Secondary | ICD-10-CM | POA: Diagnosis not present

## 2017-09-09 DIAGNOSIS — Z8611 Personal history of tuberculosis: Secondary | ICD-10-CM | POA: Diagnosis not present

## 2017-09-09 DIAGNOSIS — Z7952 Long term (current) use of systemic steroids: Secondary | ICD-10-CM | POA: Diagnosis not present

## 2017-09-09 DIAGNOSIS — T380X5D Adverse effect of glucocorticoids and synthetic analogues, subsequent encounter: Secondary | ICD-10-CM | POA: Diagnosis not present

## 2017-09-11 DIAGNOSIS — J449 Chronic obstructive pulmonary disease, unspecified: Secondary | ICD-10-CM | POA: Diagnosis not present

## 2017-09-11 DIAGNOSIS — T380X5D Adverse effect of glucocorticoids and synthetic analogues, subsequent encounter: Secondary | ICD-10-CM | POA: Diagnosis not present

## 2017-09-11 DIAGNOSIS — J9611 Chronic respiratory failure with hypoxia: Secondary | ICD-10-CM | POA: Diagnosis not present

## 2017-09-11 DIAGNOSIS — Z792 Long term (current) use of antibiotics: Secondary | ICD-10-CM | POA: Diagnosis not present

## 2017-09-11 DIAGNOSIS — F17211 Nicotine dependence, cigarettes, in remission: Secondary | ICD-10-CM | POA: Diagnosis not present

## 2017-09-11 DIAGNOSIS — R7303 Prediabetes: Secondary | ICD-10-CM | POA: Diagnosis not present

## 2017-09-12 DIAGNOSIS — F17211 Nicotine dependence, cigarettes, in remission: Secondary | ICD-10-CM | POA: Diagnosis not present

## 2017-09-12 DIAGNOSIS — J449 Chronic obstructive pulmonary disease, unspecified: Secondary | ICD-10-CM | POA: Diagnosis not present

## 2017-09-12 DIAGNOSIS — J9611 Chronic respiratory failure with hypoxia: Secondary | ICD-10-CM | POA: Diagnosis not present

## 2017-09-12 DIAGNOSIS — R7303 Prediabetes: Secondary | ICD-10-CM | POA: Diagnosis not present

## 2017-09-12 DIAGNOSIS — Z792 Long term (current) use of antibiotics: Secondary | ICD-10-CM | POA: Diagnosis not present

## 2017-09-12 DIAGNOSIS — T380X5D Adverse effect of glucocorticoids and synthetic analogues, subsequent encounter: Secondary | ICD-10-CM | POA: Diagnosis not present

## 2017-09-17 DIAGNOSIS — Z792 Long term (current) use of antibiotics: Secondary | ICD-10-CM | POA: Diagnosis not present

## 2017-09-17 DIAGNOSIS — R7303 Prediabetes: Secondary | ICD-10-CM | POA: Diagnosis not present

## 2017-09-17 DIAGNOSIS — F17211 Nicotine dependence, cigarettes, in remission: Secondary | ICD-10-CM | POA: Diagnosis not present

## 2017-09-17 DIAGNOSIS — J9611 Chronic respiratory failure with hypoxia: Secondary | ICD-10-CM | POA: Diagnosis not present

## 2017-09-17 DIAGNOSIS — T380X5D Adverse effect of glucocorticoids and synthetic analogues, subsequent encounter: Secondary | ICD-10-CM | POA: Diagnosis not present

## 2017-09-17 DIAGNOSIS — J449 Chronic obstructive pulmonary disease, unspecified: Secondary | ICD-10-CM | POA: Diagnosis not present

## 2017-09-19 DIAGNOSIS — F17211 Nicotine dependence, cigarettes, in remission: Secondary | ICD-10-CM | POA: Diagnosis not present

## 2017-09-19 DIAGNOSIS — J9611 Chronic respiratory failure with hypoxia: Secondary | ICD-10-CM | POA: Diagnosis not present

## 2017-09-19 DIAGNOSIS — T380X5D Adverse effect of glucocorticoids and synthetic analogues, subsequent encounter: Secondary | ICD-10-CM | POA: Diagnosis not present

## 2017-09-19 DIAGNOSIS — Z792 Long term (current) use of antibiotics: Secondary | ICD-10-CM | POA: Diagnosis not present

## 2017-09-19 DIAGNOSIS — J449 Chronic obstructive pulmonary disease, unspecified: Secondary | ICD-10-CM | POA: Diagnosis not present

## 2017-09-19 DIAGNOSIS — R7303 Prediabetes: Secondary | ICD-10-CM | POA: Diagnosis not present

## 2017-09-24 DIAGNOSIS — F17211 Nicotine dependence, cigarettes, in remission: Secondary | ICD-10-CM | POA: Diagnosis not present

## 2017-09-24 DIAGNOSIS — T380X5D Adverse effect of glucocorticoids and synthetic analogues, subsequent encounter: Secondary | ICD-10-CM | POA: Diagnosis not present

## 2017-09-24 DIAGNOSIS — Z792 Long term (current) use of antibiotics: Secondary | ICD-10-CM | POA: Diagnosis not present

## 2017-09-24 DIAGNOSIS — J449 Chronic obstructive pulmonary disease, unspecified: Secondary | ICD-10-CM | POA: Diagnosis not present

## 2017-09-24 DIAGNOSIS — J9611 Chronic respiratory failure with hypoxia: Secondary | ICD-10-CM | POA: Diagnosis not present

## 2017-09-24 DIAGNOSIS — R7303 Prediabetes: Secondary | ICD-10-CM | POA: Diagnosis not present

## 2017-10-01 DIAGNOSIS — T380X5D Adverse effect of glucocorticoids and synthetic analogues, subsequent encounter: Secondary | ICD-10-CM | POA: Diagnosis not present

## 2017-10-01 DIAGNOSIS — J449 Chronic obstructive pulmonary disease, unspecified: Secondary | ICD-10-CM | POA: Diagnosis not present

## 2017-10-01 DIAGNOSIS — F17211 Nicotine dependence, cigarettes, in remission: Secondary | ICD-10-CM | POA: Diagnosis not present

## 2017-10-01 DIAGNOSIS — R7303 Prediabetes: Secondary | ICD-10-CM | POA: Diagnosis not present

## 2017-10-01 DIAGNOSIS — J9611 Chronic respiratory failure with hypoxia: Secondary | ICD-10-CM | POA: Diagnosis not present

## 2017-10-01 DIAGNOSIS — Z792 Long term (current) use of antibiotics: Secondary | ICD-10-CM | POA: Diagnosis not present

## 2017-10-04 DIAGNOSIS — Z792 Long term (current) use of antibiotics: Secondary | ICD-10-CM | POA: Diagnosis not present

## 2017-10-04 DIAGNOSIS — J9611 Chronic respiratory failure with hypoxia: Secondary | ICD-10-CM | POA: Diagnosis not present

## 2017-10-04 DIAGNOSIS — J449 Chronic obstructive pulmonary disease, unspecified: Secondary | ICD-10-CM | POA: Diagnosis not present

## 2017-10-04 DIAGNOSIS — F17211 Nicotine dependence, cigarettes, in remission: Secondary | ICD-10-CM | POA: Diagnosis not present

## 2017-10-04 DIAGNOSIS — T380X5D Adverse effect of glucocorticoids and synthetic analogues, subsequent encounter: Secondary | ICD-10-CM | POA: Diagnosis not present

## 2017-10-04 DIAGNOSIS — R7303 Prediabetes: Secondary | ICD-10-CM | POA: Diagnosis not present

## 2017-10-07 DIAGNOSIS — L602 Onychogryphosis: Secondary | ICD-10-CM | POA: Diagnosis not present

## 2017-10-07 DIAGNOSIS — L600 Ingrowing nail: Secondary | ICD-10-CM | POA: Diagnosis not present

## 2017-10-07 DIAGNOSIS — E1142 Type 2 diabetes mellitus with diabetic polyneuropathy: Secondary | ICD-10-CM | POA: Diagnosis not present

## 2017-10-07 DIAGNOSIS — B353 Tinea pedis: Secondary | ICD-10-CM | POA: Diagnosis not present

## 2017-10-10 DIAGNOSIS — J449 Chronic obstructive pulmonary disease, unspecified: Secondary | ICD-10-CM | POA: Diagnosis not present

## 2017-10-10 DIAGNOSIS — T380X5D Adverse effect of glucocorticoids and synthetic analogues, subsequent encounter: Secondary | ICD-10-CM | POA: Diagnosis not present

## 2017-10-10 DIAGNOSIS — R7303 Prediabetes: Secondary | ICD-10-CM | POA: Diagnosis not present

## 2017-10-10 DIAGNOSIS — F17211 Nicotine dependence, cigarettes, in remission: Secondary | ICD-10-CM | POA: Diagnosis not present

## 2017-10-10 DIAGNOSIS — J9611 Chronic respiratory failure with hypoxia: Secondary | ICD-10-CM | POA: Diagnosis not present

## 2017-10-10 DIAGNOSIS — Z792 Long term (current) use of antibiotics: Secondary | ICD-10-CM | POA: Diagnosis not present

## 2017-10-17 DIAGNOSIS — J449 Chronic obstructive pulmonary disease, unspecified: Secondary | ICD-10-CM | POA: Diagnosis not present

## 2017-10-17 DIAGNOSIS — R7303 Prediabetes: Secondary | ICD-10-CM | POA: Diagnosis not present

## 2017-10-17 DIAGNOSIS — F17211 Nicotine dependence, cigarettes, in remission: Secondary | ICD-10-CM | POA: Diagnosis not present

## 2017-10-17 DIAGNOSIS — J9611 Chronic respiratory failure with hypoxia: Secondary | ICD-10-CM | POA: Diagnosis not present

## 2017-10-17 DIAGNOSIS — T380X5D Adverse effect of glucocorticoids and synthetic analogues, subsequent encounter: Secondary | ICD-10-CM | POA: Diagnosis not present

## 2017-10-17 DIAGNOSIS — Z792 Long term (current) use of antibiotics: Secondary | ICD-10-CM | POA: Diagnosis not present

## 2017-10-24 DIAGNOSIS — J449 Chronic obstructive pulmonary disease, unspecified: Secondary | ICD-10-CM | POA: Diagnosis not present

## 2017-10-24 DIAGNOSIS — T380X5D Adverse effect of glucocorticoids and synthetic analogues, subsequent encounter: Secondary | ICD-10-CM | POA: Diagnosis not present

## 2017-10-24 DIAGNOSIS — Z792 Long term (current) use of antibiotics: Secondary | ICD-10-CM | POA: Diagnosis not present

## 2017-10-24 DIAGNOSIS — J9611 Chronic respiratory failure with hypoxia: Secondary | ICD-10-CM | POA: Diagnosis not present

## 2017-10-24 DIAGNOSIS — F17211 Nicotine dependence, cigarettes, in remission: Secondary | ICD-10-CM | POA: Diagnosis not present

## 2017-10-24 DIAGNOSIS — R7303 Prediabetes: Secondary | ICD-10-CM | POA: Diagnosis not present

## 2017-10-28 DIAGNOSIS — F17211 Nicotine dependence, cigarettes, in remission: Secondary | ICD-10-CM | POA: Diagnosis not present

## 2017-10-28 DIAGNOSIS — Z792 Long term (current) use of antibiotics: Secondary | ICD-10-CM | POA: Diagnosis not present

## 2017-10-28 DIAGNOSIS — J9611 Chronic respiratory failure with hypoxia: Secondary | ICD-10-CM | POA: Diagnosis not present

## 2017-10-28 DIAGNOSIS — J449 Chronic obstructive pulmonary disease, unspecified: Secondary | ICD-10-CM | POA: Diagnosis not present

## 2017-10-28 DIAGNOSIS — T380X5D Adverse effect of glucocorticoids and synthetic analogues, subsequent encounter: Secondary | ICD-10-CM | POA: Diagnosis not present

## 2017-10-28 DIAGNOSIS — R7303 Prediabetes: Secondary | ICD-10-CM | POA: Diagnosis not present

## 2017-11-06 DIAGNOSIS — J449 Chronic obstructive pulmonary disease, unspecified: Secondary | ICD-10-CM | POA: Diagnosis not present

## 2017-11-06 DIAGNOSIS — R0781 Pleurodynia: Secondary | ICD-10-CM | POA: Diagnosis not present

## 2017-11-12 DIAGNOSIS — E669 Obesity, unspecified: Secondary | ICD-10-CM | POA: Diagnosis not present

## 2017-11-12 DIAGNOSIS — J449 Chronic obstructive pulmonary disease, unspecified: Secondary | ICD-10-CM | POA: Diagnosis not present

## 2017-11-12 DIAGNOSIS — J9611 Chronic respiratory failure with hypoxia: Secondary | ICD-10-CM | POA: Diagnosis not present

## 2017-11-13 DIAGNOSIS — J449 Chronic obstructive pulmonary disease, unspecified: Secondary | ICD-10-CM | POA: Diagnosis not present

## 2017-11-13 DIAGNOSIS — R0602 Shortness of breath: Secondary | ICD-10-CM | POA: Diagnosis not present

## 2017-11-13 DIAGNOSIS — R52 Pain, unspecified: Secondary | ICD-10-CM | POA: Diagnosis not present

## 2017-11-19 DIAGNOSIS — J449 Chronic obstructive pulmonary disease, unspecified: Secondary | ICD-10-CM | POA: Diagnosis not present

## 2018-01-22 DIAGNOSIS — J449 Chronic obstructive pulmonary disease, unspecified: Secondary | ICD-10-CM | POA: Diagnosis not present

## 2018-01-22 DIAGNOSIS — R0602 Shortness of breath: Secondary | ICD-10-CM | POA: Diagnosis not present

## 2018-01-22 DIAGNOSIS — R52 Pain, unspecified: Secondary | ICD-10-CM | POA: Diagnosis not present

## 2018-01-30 DIAGNOSIS — Z87891 Personal history of nicotine dependence: Secondary | ICD-10-CM | POA: Diagnosis not present

## 2018-01-30 DIAGNOSIS — J9611 Chronic respiratory failure with hypoxia: Secondary | ICD-10-CM | POA: Diagnosis not present

## 2018-01-30 DIAGNOSIS — J449 Chronic obstructive pulmonary disease, unspecified: Secondary | ICD-10-CM | POA: Diagnosis not present

## 2018-02-12 DIAGNOSIS — J449 Chronic obstructive pulmonary disease, unspecified: Secondary | ICD-10-CM | POA: Diagnosis not present

## 2018-02-12 DIAGNOSIS — R918 Other nonspecific abnormal finding of lung field: Secondary | ICD-10-CM | POA: Diagnosis not present

## 2018-02-12 DIAGNOSIS — J9611 Chronic respiratory failure with hypoxia: Secondary | ICD-10-CM | POA: Diagnosis not present

## 2018-02-12 DIAGNOSIS — Z6827 Body mass index (BMI) 27.0-27.9, adult: Secondary | ICD-10-CM | POA: Diagnosis not present

## 2018-02-18 DIAGNOSIS — R918 Other nonspecific abnormal finding of lung field: Secondary | ICD-10-CM | POA: Diagnosis not present

## 2018-02-18 DIAGNOSIS — Z87891 Personal history of nicotine dependence: Secondary | ICD-10-CM | POA: Diagnosis not present

## 2018-03-03 DIAGNOSIS — R0602 Shortness of breath: Secondary | ICD-10-CM | POA: Diagnosis not present

## 2018-03-03 DIAGNOSIS — J449 Chronic obstructive pulmonary disease, unspecified: Secondary | ICD-10-CM | POA: Diagnosis not present

## 2018-03-03 DIAGNOSIS — R52 Pain, unspecified: Secondary | ICD-10-CM | POA: Diagnosis not present

## 2018-03-05 DIAGNOSIS — E785 Hyperlipidemia, unspecified: Secondary | ICD-10-CM | POA: Diagnosis not present

## 2018-03-05 DIAGNOSIS — J439 Emphysema, unspecified: Secondary | ICD-10-CM | POA: Diagnosis not present

## 2018-03-05 DIAGNOSIS — Z9981 Dependence on supplemental oxygen: Secondary | ICD-10-CM | POA: Diagnosis not present

## 2018-03-05 DIAGNOSIS — E099 Drug or chemical induced diabetes mellitus without complications: Secondary | ICD-10-CM | POA: Diagnosis not present

## 2018-03-05 DIAGNOSIS — Z87891 Personal history of nicotine dependence: Secondary | ICD-10-CM | POA: Diagnosis not present

## 2018-03-05 DIAGNOSIS — R918 Other nonspecific abnormal finding of lung field: Secondary | ICD-10-CM | POA: Diagnosis not present

## 2018-03-05 DIAGNOSIS — I2510 Atherosclerotic heart disease of native coronary artery without angina pectoris: Secondary | ICD-10-CM | POA: Diagnosis not present

## 2018-03-05 DIAGNOSIS — G4733 Obstructive sleep apnea (adult) (pediatric): Secondary | ICD-10-CM | POA: Diagnosis not present

## 2018-03-05 DIAGNOSIS — E669 Obesity, unspecified: Secondary | ICD-10-CM | POA: Diagnosis not present

## 2018-03-05 DIAGNOSIS — Z7984 Long term (current) use of oral hypoglycemic drugs: Secondary | ICD-10-CM | POA: Diagnosis not present

## 2018-03-05 DIAGNOSIS — J9611 Chronic respiratory failure with hypoxia: Secondary | ICD-10-CM | POA: Diagnosis not present

## 2018-03-07 DIAGNOSIS — E669 Obesity, unspecified: Secondary | ICD-10-CM | POA: Diagnosis not present

## 2018-03-07 DIAGNOSIS — Z7984 Long term (current) use of oral hypoglycemic drugs: Secondary | ICD-10-CM | POA: Diagnosis not present

## 2018-03-07 DIAGNOSIS — G4733 Obstructive sleep apnea (adult) (pediatric): Secondary | ICD-10-CM | POA: Diagnosis not present

## 2018-03-07 DIAGNOSIS — E099 Drug or chemical induced diabetes mellitus without complications: Secondary | ICD-10-CM | POA: Diagnosis not present

## 2018-03-07 DIAGNOSIS — R918 Other nonspecific abnormal finding of lung field: Secondary | ICD-10-CM | POA: Diagnosis not present

## 2018-03-07 DIAGNOSIS — J439 Emphysema, unspecified: Secondary | ICD-10-CM | POA: Diagnosis not present

## 2018-03-07 DIAGNOSIS — J9611 Chronic respiratory failure with hypoxia: Secondary | ICD-10-CM | POA: Diagnosis not present

## 2018-03-07 DIAGNOSIS — Z87891 Personal history of nicotine dependence: Secondary | ICD-10-CM | POA: Diagnosis not present

## 2018-03-07 DIAGNOSIS — I2510 Atherosclerotic heart disease of native coronary artery without angina pectoris: Secondary | ICD-10-CM | POA: Diagnosis not present

## 2018-03-07 DIAGNOSIS — E785 Hyperlipidemia, unspecified: Secondary | ICD-10-CM | POA: Diagnosis not present

## 2018-03-07 DIAGNOSIS — Z9981 Dependence on supplemental oxygen: Secondary | ICD-10-CM | POA: Diagnosis not present

## 2018-03-10 DIAGNOSIS — L600 Ingrowing nail: Secondary | ICD-10-CM | POA: Diagnosis not present

## 2018-03-10 DIAGNOSIS — E1142 Type 2 diabetes mellitus with diabetic polyneuropathy: Secondary | ICD-10-CM | POA: Diagnosis not present

## 2018-03-10 DIAGNOSIS — L602 Onychogryphosis: Secondary | ICD-10-CM | POA: Diagnosis not present

## 2018-03-13 DIAGNOSIS — E669 Obesity, unspecified: Secondary | ICD-10-CM | POA: Diagnosis not present

## 2018-03-13 DIAGNOSIS — J439 Emphysema, unspecified: Secondary | ICD-10-CM | POA: Diagnosis not present

## 2018-03-13 DIAGNOSIS — J9611 Chronic respiratory failure with hypoxia: Secondary | ICD-10-CM | POA: Diagnosis not present

## 2018-03-13 DIAGNOSIS — I2510 Atherosclerotic heart disease of native coronary artery without angina pectoris: Secondary | ICD-10-CM | POA: Diagnosis not present

## 2018-03-13 DIAGNOSIS — Z87891 Personal history of nicotine dependence: Secondary | ICD-10-CM | POA: Diagnosis not present

## 2018-03-13 DIAGNOSIS — Z9981 Dependence on supplemental oxygen: Secondary | ICD-10-CM | POA: Diagnosis not present

## 2018-03-19 DIAGNOSIS — Z87891 Personal history of nicotine dependence: Secondary | ICD-10-CM | POA: Diagnosis not present

## 2018-03-19 DIAGNOSIS — I2510 Atherosclerotic heart disease of native coronary artery without angina pectoris: Secondary | ICD-10-CM | POA: Diagnosis not present

## 2018-03-19 DIAGNOSIS — Z9981 Dependence on supplemental oxygen: Secondary | ICD-10-CM | POA: Diagnosis not present

## 2018-03-19 DIAGNOSIS — J439 Emphysema, unspecified: Secondary | ICD-10-CM | POA: Diagnosis not present

## 2018-03-19 DIAGNOSIS — J9611 Chronic respiratory failure with hypoxia: Secondary | ICD-10-CM | POA: Diagnosis not present

## 2018-03-19 DIAGNOSIS — E669 Obesity, unspecified: Secondary | ICD-10-CM | POA: Diagnosis not present

## 2018-03-20 DIAGNOSIS — Z9981 Dependence on supplemental oxygen: Secondary | ICD-10-CM | POA: Diagnosis not present

## 2018-03-20 DIAGNOSIS — J9611 Chronic respiratory failure with hypoxia: Secondary | ICD-10-CM | POA: Diagnosis not present

## 2018-03-20 DIAGNOSIS — E669 Obesity, unspecified: Secondary | ICD-10-CM | POA: Diagnosis not present

## 2018-03-20 DIAGNOSIS — Z87891 Personal history of nicotine dependence: Secondary | ICD-10-CM | POA: Diagnosis not present

## 2018-03-20 DIAGNOSIS — J439 Emphysema, unspecified: Secondary | ICD-10-CM | POA: Diagnosis not present

## 2018-03-20 DIAGNOSIS — I2510 Atherosclerotic heart disease of native coronary artery without angina pectoris: Secondary | ICD-10-CM | POA: Diagnosis not present

## 2018-03-27 DIAGNOSIS — J439 Emphysema, unspecified: Secondary | ICD-10-CM | POA: Diagnosis not present

## 2018-03-27 DIAGNOSIS — J9611 Chronic respiratory failure with hypoxia: Secondary | ICD-10-CM | POA: Diagnosis not present

## 2018-03-27 DIAGNOSIS — I2510 Atherosclerotic heart disease of native coronary artery without angina pectoris: Secondary | ICD-10-CM | POA: Diagnosis not present

## 2018-03-27 DIAGNOSIS — Z9981 Dependence on supplemental oxygen: Secondary | ICD-10-CM | POA: Diagnosis not present

## 2018-03-27 DIAGNOSIS — Z87891 Personal history of nicotine dependence: Secondary | ICD-10-CM | POA: Diagnosis not present

## 2018-03-27 DIAGNOSIS — E669 Obesity, unspecified: Secondary | ICD-10-CM | POA: Diagnosis not present

## 2018-04-06 DIAGNOSIS — E669 Obesity, unspecified: Secondary | ICD-10-CM | POA: Diagnosis not present

## 2018-04-06 DIAGNOSIS — E785 Hyperlipidemia, unspecified: Secondary | ICD-10-CM | POA: Diagnosis not present

## 2018-04-06 DIAGNOSIS — E099 Drug or chemical induced diabetes mellitus without complications: Secondary | ICD-10-CM | POA: Diagnosis not present

## 2018-04-06 DIAGNOSIS — J9611 Chronic respiratory failure with hypoxia: Secondary | ICD-10-CM | POA: Diagnosis not present

## 2018-04-06 DIAGNOSIS — R918 Other nonspecific abnormal finding of lung field: Secondary | ICD-10-CM | POA: Diagnosis not present

## 2018-04-06 DIAGNOSIS — J439 Emphysema, unspecified: Secondary | ICD-10-CM | POA: Diagnosis not present

## 2018-04-06 DIAGNOSIS — Z7984 Long term (current) use of oral hypoglycemic drugs: Secondary | ICD-10-CM | POA: Diagnosis not present

## 2018-04-06 DIAGNOSIS — G4733 Obstructive sleep apnea (adult) (pediatric): Secondary | ICD-10-CM | POA: Diagnosis not present

## 2018-04-06 DIAGNOSIS — Z87891 Personal history of nicotine dependence: Secondary | ICD-10-CM | POA: Diagnosis not present

## 2018-04-06 DIAGNOSIS — I2510 Atherosclerotic heart disease of native coronary artery without angina pectoris: Secondary | ICD-10-CM | POA: Diagnosis not present

## 2018-04-06 DIAGNOSIS — Z9981 Dependence on supplemental oxygen: Secondary | ICD-10-CM | POA: Diagnosis not present

## 2018-04-10 DIAGNOSIS — J439 Emphysema, unspecified: Secondary | ICD-10-CM | POA: Diagnosis not present

## 2018-04-10 DIAGNOSIS — I2510 Atherosclerotic heart disease of native coronary artery without angina pectoris: Secondary | ICD-10-CM | POA: Diagnosis not present

## 2018-04-10 DIAGNOSIS — E669 Obesity, unspecified: Secondary | ICD-10-CM | POA: Diagnosis not present

## 2018-04-10 DIAGNOSIS — Z87891 Personal history of nicotine dependence: Secondary | ICD-10-CM | POA: Diagnosis not present

## 2018-04-10 DIAGNOSIS — Z9981 Dependence on supplemental oxygen: Secondary | ICD-10-CM | POA: Diagnosis not present

## 2018-04-10 DIAGNOSIS — J9611 Chronic respiratory failure with hypoxia: Secondary | ICD-10-CM | POA: Diagnosis not present

## 2018-04-11 DIAGNOSIS — I2510 Atherosclerotic heart disease of native coronary artery without angina pectoris: Secondary | ICD-10-CM | POA: Diagnosis not present

## 2018-04-11 DIAGNOSIS — Z9981 Dependence on supplemental oxygen: Secondary | ICD-10-CM | POA: Diagnosis not present

## 2018-04-11 DIAGNOSIS — Z87891 Personal history of nicotine dependence: Secondary | ICD-10-CM | POA: Diagnosis not present

## 2018-04-11 DIAGNOSIS — J439 Emphysema, unspecified: Secondary | ICD-10-CM | POA: Diagnosis not present

## 2018-04-11 DIAGNOSIS — E669 Obesity, unspecified: Secondary | ICD-10-CM | POA: Diagnosis not present

## 2018-04-11 DIAGNOSIS — J9611 Chronic respiratory failure with hypoxia: Secondary | ICD-10-CM | POA: Diagnosis not present

## 2018-04-17 DIAGNOSIS — J9611 Chronic respiratory failure with hypoxia: Secondary | ICD-10-CM | POA: Diagnosis not present

## 2018-04-17 DIAGNOSIS — E669 Obesity, unspecified: Secondary | ICD-10-CM | POA: Diagnosis not present

## 2018-04-17 DIAGNOSIS — J439 Emphysema, unspecified: Secondary | ICD-10-CM | POA: Diagnosis not present

## 2018-04-17 DIAGNOSIS — Z87891 Personal history of nicotine dependence: Secondary | ICD-10-CM | POA: Diagnosis not present

## 2018-04-17 DIAGNOSIS — Z9981 Dependence on supplemental oxygen: Secondary | ICD-10-CM | POA: Diagnosis not present

## 2018-04-17 DIAGNOSIS — I2510 Atherosclerotic heart disease of native coronary artery without angina pectoris: Secondary | ICD-10-CM | POA: Diagnosis not present

## 2018-04-25 DIAGNOSIS — J439 Emphysema, unspecified: Secondary | ICD-10-CM | POA: Diagnosis not present

## 2018-04-25 DIAGNOSIS — Z87891 Personal history of nicotine dependence: Secondary | ICD-10-CM | POA: Diagnosis not present

## 2018-04-25 DIAGNOSIS — I2510 Atherosclerotic heart disease of native coronary artery without angina pectoris: Secondary | ICD-10-CM | POA: Diagnosis not present

## 2018-04-25 DIAGNOSIS — Z9981 Dependence on supplemental oxygen: Secondary | ICD-10-CM | POA: Diagnosis not present

## 2018-04-25 DIAGNOSIS — E669 Obesity, unspecified: Secondary | ICD-10-CM | POA: Diagnosis not present

## 2018-04-25 DIAGNOSIS — J9611 Chronic respiratory failure with hypoxia: Secondary | ICD-10-CM | POA: Diagnosis not present

## 2018-05-02 DIAGNOSIS — J9611 Chronic respiratory failure with hypoxia: Secondary | ICD-10-CM | POA: Diagnosis not present

## 2018-05-02 DIAGNOSIS — Z87891 Personal history of nicotine dependence: Secondary | ICD-10-CM | POA: Diagnosis not present

## 2018-05-02 DIAGNOSIS — E669 Obesity, unspecified: Secondary | ICD-10-CM | POA: Diagnosis not present

## 2018-05-02 DIAGNOSIS — I2510 Atherosclerotic heart disease of native coronary artery without angina pectoris: Secondary | ICD-10-CM | POA: Diagnosis not present

## 2018-05-02 DIAGNOSIS — Z9981 Dependence on supplemental oxygen: Secondary | ICD-10-CM | POA: Diagnosis not present

## 2018-05-02 DIAGNOSIS — J439 Emphysema, unspecified: Secondary | ICD-10-CM | POA: Diagnosis not present

## 2019-11-05 DEATH — deceased
